# Patient Record
Sex: Female | Born: 1980 | Race: White | Hispanic: No | Marital: Single | Smoking: Current every day smoker
Health system: Southern US, Community
[De-identification: ages and names within clinical notes are randomized; demographics above are authoritative.]

## PROBLEM LIST (undated history)

## (undated) DIAGNOSIS — F319 Bipolar disorder, unspecified: Secondary | ICD-10-CM

## (undated) DIAGNOSIS — N200 Calculus of kidney: Secondary | ICD-10-CM

---

## 2006-09-16 HISTORY — PX: TUBAL LIGATION: SHX77

## 2008-01-12 ENCOUNTER — Emergency Department (HOSPITAL_COMMUNITY): Admission: EM | Admit: 2008-01-12 | Discharge: 2008-01-12 | Payer: Self-pay | Admitting: Emergency Medicine

## 2010-09-05 ENCOUNTER — Emergency Department (HOSPITAL_COMMUNITY)
Admission: EM | Admit: 2010-09-05 | Discharge: 2010-09-05 | Payer: Self-pay | Source: Home / Self Care | Admitting: Emergency Medicine

## 2010-10-29 HISTORY — PX: OTHER SURGICAL HISTORY: SHX169

## 2010-11-19 ENCOUNTER — Emergency Department (HOSPITAL_COMMUNITY)
Admission: EM | Admit: 2010-11-19 | Discharge: 2010-11-19 | Disposition: A | Discharge status: Other Acute Inpatient Hospital | Payer: Self-pay | Attending: Emergency Medicine | Admitting: Emergency Medicine

## 2010-11-19 ENCOUNTER — Inpatient Hospital Stay (HOSPITAL_COMMUNITY)
Admission: RE | Admit: 2010-11-19 | Discharge: 2010-11-22 | DRG: 885 | Disposition: A | Discharge status: Home / Self Care | Payer: PRIVATE HEALTH INSURANCE | Source: Ambulatory Visit | Attending: Psychiatry | Admitting: Psychiatry

## 2010-11-19 DIAGNOSIS — N390 Urinary tract infection, site not specified: Secondary | ICD-10-CM | POA: Insufficient documentation

## 2010-11-19 DIAGNOSIS — Z560 Unemployment, unspecified: Secondary | ICD-10-CM

## 2010-11-19 DIAGNOSIS — F319 Bipolar disorder, unspecified: Principal | ICD-10-CM

## 2010-11-19 DIAGNOSIS — R3919 Other difficulties with micturition: Secondary | ICD-10-CM | POA: Insufficient documentation

## 2010-11-19 DIAGNOSIS — IMO0002 Reserved for concepts with insufficient information to code with codable children: Secondary | ICD-10-CM

## 2010-11-19 DIAGNOSIS — R45851 Suicidal ideations: Secondary | ICD-10-CM | POA: Insufficient documentation

## 2010-11-19 DIAGNOSIS — F3130 Bipolar disorder, current episode depressed, mild or moderate severity, unspecified: Secondary | ICD-10-CM | POA: Insufficient documentation

## 2010-11-19 DIAGNOSIS — F29 Unspecified psychosis not due to a substance or known physiological condition: Secondary | ICD-10-CM

## 2010-11-19 DIAGNOSIS — F259 Schizoaffective disorder, unspecified: Secondary | ICD-10-CM

## 2010-11-19 DIAGNOSIS — Z79899 Other long term (current) drug therapy: Secondary | ICD-10-CM | POA: Insufficient documentation

## 2010-11-19 LAB — DIFFERENTIAL
Basophils Relative: 0 % (ref 0–1)
Eosinophils Relative: 3 % (ref 0–5)
Lymphocytes Relative: 19 % (ref 12–46)
Lymphs Abs: 2 10*3/uL (ref 0.7–4.0)
Monocytes Absolute: 0.6 10*3/uL (ref 0.1–1.0)
Monocytes Relative: 6 % (ref 3–12)

## 2010-11-19 LAB — PREGNANCY, URINE: Preg Test, Ur: NEGATIVE

## 2010-11-19 LAB — URINALYSIS, ROUTINE W REFLEX MICROSCOPIC
Ketones, ur: 15 mg/dL — AB
Specific Gravity, Urine: 1.026 (ref 1.005–1.030)

## 2010-11-19 LAB — URINE MICROSCOPIC-ADD ON

## 2010-11-19 LAB — CBC: MCHC: 34.8 g/dL (ref 30.0–36.0)

## 2010-11-19 LAB — COMPREHENSIVE METABOLIC PANEL
CO2: 26 mEq/L (ref 19–32)
Calcium: 9.3 mg/dL (ref 8.4–10.5)
Chloride: 108 mEq/L (ref 96–112)
Creatinine, Ser: 0.7 mg/dL (ref 0.4–1.2)
GFR calc Af Amer: >60 mL/min (ref >60)
Potassium: 4.2 mEq/L (ref 3.5–5.1)
Sodium: 140 mEq/L (ref 135–145)

## 2010-11-19 LAB — RAPID URINE DRUG SCREEN, HOSP PERFORMED
Barbiturates: NOT DETECTED
Opiates: NOT DETECTED
Tetrahydrocannabinol: POSITIVE — AB

## 2010-11-19 LAB — ETHANOL: Alcohol, Ethyl (B): <5 mg/dL (ref 0–10)

## 2010-11-20 DIAGNOSIS — F312 Bipolar disorder, current episode manic severe with psychotic features: Secondary | ICD-10-CM
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
## 2010-11-21 LAB — URINE CULTURE
Colony Count: >=100000
Culture  Setup Time: 201203060005

## 2010-12-11 NOTE — Discharge Summary (Signed)
  NAMEJOSSELIN, [name]             ACCOUNT NO.:  [id number]  MEDICAL RECORD NO.:  [id number]           PATIENT TYPE:  I  LOCATION:  0401                          FACILITY:  [facility]  PHYSICIAN:  Eulogio Ditch, MD DATE OF BIRTH:  [birth year]  DATE OF ADMISSION:  11/19/2010 DATE OF DISCHARGE:  11/22/2010                              DISCHARGE SUMMARY   IDENTIFYING INFORMATION:  This is a 30 year old single female.  This is a voluntary admission.  HISTORY OF PRESENT ILLNESS:  First [facility] admission for [name] who has a history of bipolar disorder and presented with an exacerbation of symptoms, complaining of having racing thoughts.  Family had complained to her that she was hyperverbal and having some vague auditory hallucinations of a female voice.  She had also been sleeping poorly and was having some suicidal thoughts fueled by voices telling her to be destructive and cut herself.  MEDICAL EVALUATION AND DIAGNOSTIC STUDIES:  Physical exam was done in the emergency room.  Urine drug screen was positive for marijuana. Pregnancy test negative.  She denied any chronic medical conditions.  COURSE OF HOSPITALIZATION:  She was admitted to our mood disorders program and reported that she had been using marijuana occasionally to help herself feel calmed down.  She reported previous successful trials with Lamictal, trazodone, Celexa, Klonopin and Depakote.  She was gradually assimilated into the milieu and was started on Lamictal 25 mg daily and 200 mg as needed for sleep.  Klonopin 0.5 mg b.i.d. was provided as needed for anxiety and she was started on Celexa 20 mg daily.  By November 21, 2010 she reported feeling much better, was feeling more like herself, had slept adequately, no dangerous thoughts.  She did complain of some dysuria and had a positive urine culture and was started on Macrodantin.  She had permission for [place] to speak with her friend with whom she lives.  Her friend indicated her  support and no safety concerns.  By November 22, 2010 she was ready for discharge.  No dangerous ideas, in full contact with reality and requesting to proceed with outpatient treatment.  DISCHARGE DIAGNOSIS:  AXIS I:  Bipolar disorder not otherwise specified. AXIS II:  No diagnosis. AXIS III:  Urinary tract infection. AXIS IV: No diagnosis. AXIS V: 56.  DISCHARGE MEDICATIONS:  Celexa 20 mg daily. Klonopin 0.5 mg t.i.d. p.r.n. anxiety. Lamotrigine 25 mg b.i.d. Nitrofurantoin 50 mg q.i.d. until finished. Risperdal 0.5 mg b.i.d. Trazodone 100 mg daily at bedtime. Albuterol 2 puffs b.i.d. p.r.n. for asthma.  FOLLOWUP PLAN:  She is to follow up with Wallace Keller on December 10, 2010 at 11:30 a.m. at [facility] at [place].     Margaret A. Lorin Picket, N.P.   ______________________________ Eulogio Ditch, MD    MAS/MEDQ  D:  12/10/2010  T:  12/10/2010  Job:  [id number]  Electronically Signed by Kari Baars N.P. on 12/10/2010 04:01:07 PM Electronically Signed by Eulogio Ditch  on 12/11/2010 02:40:56 PM

## 2011-06-11 LAB — URINALYSIS, ROUTINE W REFLEX MICROSCOPIC
Bilirubin Urine: NEGATIVE
Glucose, UA: NEGATIVE
Hgb urine dipstick: NEGATIVE
Ketones, ur: NEGATIVE
Nitrite: NEGATIVE
Protein, ur: NEGATIVE

## 2011-06-11 LAB — WET PREP, GENITAL
Trich, Wet Prep: NONE SEEN
Yeast Wet Prep HPF POC: NONE SEEN

## 2011-06-11 LAB — URINE MICROSCOPIC-ADD ON

## 2011-06-11 LAB — POCT PREGNANCY, URINE: Operator id: 234501

## 2011-10-26 ENCOUNTER — Inpatient Hospital Stay (HOSPITAL_COMMUNITY)
Admission: EM | Admit: 2011-10-26 | Discharge: 2011-11-01 | DRG: 661 | Disposition: A | Discharge status: Home / Self Care | Payer: Self-pay | Attending: Internal Medicine | Admitting: Internal Medicine

## 2011-10-26 ENCOUNTER — Encounter (HOSPITAL_COMMUNITY): Payer: Self-pay | Admitting: *Deleted

## 2011-10-26 DIAGNOSIS — F319 Bipolar disorder, unspecified: Secondary | ICD-10-CM

## 2011-10-26 DIAGNOSIS — D649 Anemia, unspecified: Secondary | ICD-10-CM

## 2011-10-26 DIAGNOSIS — F17200 Nicotine dependence, unspecified, uncomplicated: Secondary | ICD-10-CM | POA: Diagnosis present

## 2011-10-26 DIAGNOSIS — K5900 Constipation, unspecified: Secondary | ICD-10-CM

## 2011-10-26 DIAGNOSIS — D509 Iron deficiency anemia, unspecified: Secondary | ICD-10-CM | POA: Diagnosis present

## 2011-10-26 DIAGNOSIS — N1330 Unspecified hydronephrosis: Secondary | ICD-10-CM | POA: Diagnosis present

## 2011-10-26 DIAGNOSIS — N390 Urinary tract infection, site not specified: Secondary | ICD-10-CM | POA: Diagnosis present

## 2011-10-26 DIAGNOSIS — B964 Proteus (mirabilis) (morganii) as the cause of diseases classified elsewhere: Secondary | ICD-10-CM | POA: Diagnosis present

## 2011-10-26 DIAGNOSIS — D72829 Elevated white blood cell count, unspecified: Secondary | ICD-10-CM | POA: Diagnosis present

## 2011-10-26 DIAGNOSIS — N200 Calculus of kidney: Secondary | ICD-10-CM | POA: Diagnosis present

## 2011-10-26 DIAGNOSIS — N12 Tubulo-interstitial nephritis, not specified as acute or chronic: Secondary | ICD-10-CM | POA: Diagnosis present

## 2011-10-26 DIAGNOSIS — N10 Acute tubulo-interstitial nephritis: Principal | ICD-10-CM | POA: Diagnosis present

## 2011-10-26 DIAGNOSIS — Z720 Tobacco use: Secondary | ICD-10-CM | POA: Diagnosis present

## 2011-10-26 HISTORY — DX: Bipolar disorder, unspecified: F31.9

## 2011-10-26 LAB — URINALYSIS, ROUTINE W REFLEX MICROSCOPIC
Bilirubin Urine: NEGATIVE
Nitrite: NEGATIVE
Specific Gravity, Urine: 1.027 (ref 1.005–1.030)
pH: 6 (ref 5.0–8.0)

## 2011-10-26 LAB — DIFFERENTIAL
Basophils Relative: 0 % (ref 0–1)
Eosinophils Relative: 0 % (ref 0–5)
Lymphocytes Relative: 5 % — ABNORMAL LOW (ref 12–46)
Lymphs Abs: 1.1 10*3/uL (ref 0.7–4.0)
Monocytes Relative: 4 % (ref 3–12)
Neutro Abs: 20.6 10*3/uL — ABNORMAL HIGH (ref 1.7–7.7)
Neutrophils Relative %: 91 % — ABNORMAL HIGH (ref 43–77)

## 2011-10-26 LAB — POCT I-STAT, CHEM 8
BUN: 7 mg/dL (ref 6–23)
Chloride: 105 mEq/L (ref 96–112)
Creatinine, Ser: 0.9 mg/dL (ref 0.50–1.10)
Glucose, Bld: 127 mg/dL — ABNORMAL HIGH (ref 70–99)
Hemoglobin: 13.9 g/dL (ref 12.0–15.0)
Potassium: 3.5 mEq/L (ref 3.5–5.1)
Sodium: 138 mEq/L (ref 135–145)

## 2011-10-26 LAB — URINE MICROSCOPIC-ADD ON

## 2011-10-26 LAB — CBC
RBC: 4.11 MIL/uL (ref 3.87–5.11)
WBC: 22.6 10*3/uL — ABNORMAL HIGH (ref 4.0–10.5)

## 2011-10-26 MED ORDER — PANTOPRAZOLE SODIUM 40 MG IV SOLR
40.0000 mg | INTRAVENOUS | Status: DC
  Administered 2011-10-26 – 2011-10-31 (×6): 40 mg via INTRAVENOUS
  Filled 2011-10-26 (×7): qty 40

## 2011-10-26 MED ORDER — PROMETHAZINE HCL 25 MG/ML IJ SOLN
25.0000 mg | Freq: Four times a day (QID) | INTRAMUSCULAR | Status: DC | PRN
  Administered 2011-10-26 – 2011-10-31 (×14): 25 mg via INTRAVENOUS
  Filled 2011-10-26 (×14): qty 1

## 2011-10-26 MED ORDER — ONDANSETRON HCL 4 MG/2ML IJ SOLN
4.0000 mg | Freq: Once | INTRAMUSCULAR | Status: AC
  Administered 2011-10-26: 4 mg via INTRAVENOUS
  Filled 2011-10-26: qty 2

## 2011-10-26 MED ORDER — IBUPROFEN 600 MG PO TABS
600.0000 mg | ORAL_TABLET | Freq: Four times a day (QID) | ORAL | Status: DC | PRN
  Filled 2011-10-26: qty 1

## 2011-10-26 MED ORDER — HYDROMORPHONE HCL PF 1 MG/ML IJ SOLN
1.0000 mg | Freq: Once | INTRAMUSCULAR | Status: AC
  Administered 2011-10-26: 1 mg via INTRAVENOUS
  Filled 2011-10-26: qty 1

## 2011-10-26 MED ORDER — ACETAMINOPHEN 325 MG PO TABS
650.0000 mg | ORAL_TABLET | Freq: Four times a day (QID) | ORAL | Status: DC | PRN
  Administered 2011-10-27: 650 mg via ORAL
  Filled 2011-10-26: qty 2

## 2011-10-26 MED ORDER — SODIUM CHLORIDE 0.9 % IV SOLN: INTRAVENOUS | Status: DC

## 2011-10-26 MED ORDER — ONDANSETRON HCL 4 MG/2ML IJ SOLN
4.0000 mg | Freq: Four times a day (QID) | INTRAMUSCULAR | Status: DC | PRN
  Administered 2011-10-26 – 2011-11-01 (×12): 4 mg via INTRAVENOUS
  Filled 2011-10-26 (×11): qty 2

## 2011-10-26 MED ORDER — DEXTROSE 5 % IV SOLN
1.0000 g | INTRAVENOUS | Status: DC
  Administered 2011-10-26: 1 g via INTRAVENOUS
  Filled 2011-10-26 (×2): qty 10

## 2011-10-26 MED ORDER — HYDROMORPHONE HCL PF 1 MG/ML IJ SOLN
1.0000 mg | INTRAMUSCULAR | Status: DC | PRN
  Administered 2011-10-26 – 2011-10-27 (×4): 2 mg via INTRAVENOUS
  Administered 2011-10-27: 1 mg via INTRAVENOUS
  Administered 2011-10-27 – 2011-10-31 (×25): 2 mg via INTRAVENOUS
  Administered 2011-10-31 (×2): 1 mg via INTRAVENOUS
  Administered 2011-10-31 – 2011-11-01 (×2): 2 mg via INTRAVENOUS
  Filled 2011-10-26 (×5): qty 2
  Filled 2011-10-26: qty 1
  Filled 2011-10-26 (×10): qty 2
  Filled 2011-10-26: qty 1
  Filled 2011-10-26 (×5): qty 2
  Filled 2011-10-26: qty 1
  Filled 2011-10-26 (×11): qty 2

## 2011-10-26 MED ORDER — HYDROMORPHONE HCL PF 1 MG/ML IJ SOLN
1.0000 mg | INTRAMUSCULAR | Status: DC | PRN
  Administered 2011-10-26: 1 mg via INTRAVENOUS
  Administered 2011-10-26: 2 mg via INTRAVENOUS
  Administered 2011-10-26: 1 mg via INTRAVENOUS
  Administered 2011-10-26: 2 mg via INTRAVENOUS
  Filled 2011-10-26: qty 2
  Filled 2011-10-26 (×2): qty 1
  Filled 2011-10-26: qty 2

## 2011-10-26 MED ORDER — BISACODYL 5 MG PO TBEC
5.0000 mg | DELAYED_RELEASE_TABLET | Freq: Every day | ORAL | Status: DC | PRN
  Administered 2011-10-31: 5 mg via ORAL
  Filled 2011-10-26: qty 1

## 2011-10-26 MED ORDER — ONDANSETRON HCL 4 MG/2ML IJ SOLN
INTRAMUSCULAR | Status: AC
  Administered 2011-10-26: 4 mg via INTRAVENOUS
  Filled 2011-10-26: qty 2

## 2011-10-26 MED ORDER — ONDANSETRON HCL 4 MG PO TABS: 4.0000 mg | ORAL_TABLET | Freq: Four times a day (QID) | ORAL | Status: DC | PRN

## 2011-10-26 MED ORDER — SODIUM CHLORIDE 0.9 % IV SOLN
20.0000 mL | INTRAVENOUS | Status: DC
  Administered 2011-10-26: 20 mL via INTRAVENOUS

## 2011-10-26 MED ORDER — ACETAMINOPHEN 650 MG RE SUPP: 650.0000 mg | Freq: Four times a day (QID) | RECTAL | Status: DC | PRN

## 2011-10-26 MED ORDER — POTASSIUM CHLORIDE IN NACL 20-0.9 MEQ/L-% IV SOLN
INTRAVENOUS | Status: DC
  Administered 2011-10-26 – 2011-11-01 (×13): via INTRAVENOUS
  Filled 2011-10-26 (×21): qty 1000

## 2011-10-26 NOTE — ED Notes (Signed)
Pt posted for admission....waiting for bed assignment

## 2011-10-26 NOTE — ED Provider Notes (Signed)
History     CSN: [id number]  Arrival date & time 10/26/11  0226   First MD Initiated Contact with Patient 10/26/11 [phone]      Chief Complaint  Patient presents with  . Flank Pain  . Nausea  . Emesis    (Consider location/radiation/quality/duration/timing/severity/associated sxs/prior treatment) Patient is a 31 y.o. female presenting with flank pain and vomiting. The history is provided by the patient.  Flank Pain  Emesis    patient here with right-sided flank pain x3 days. She's had associated nausea and vomiting. Denies hematuria dysuria. History of pyelonephritis in the past. No vaginal bleeding or discharge. No prior history of kidney stones. Nothing makes her symptoms better or worse the  History reviewed. No pertinent past medical history.  Past Surgical History  Procedure Date  . Tubal ligation     History reviewed. No pertinent family history.  History  Substance Use Topics  . Smoking status: Current Everyday Smoker -- 1.0 packs/day    Types: Cigarettes  . Smokeless tobacco: Not on file  . Alcohol Use: No    OB History    Grav Para Term Preterm Abortions TAB SAB Ect Mult Living                  Review of Systems  Gastrointestinal: Positive for vomiting.  Genitourinary: Positive for flank pain.  All other systems reviewed and are negative.    Allergies  Codeine and Ultram  Home Medications   Current Outpatient Rx  Name Route Sig Dispense Refill  . IBUPROFEN 800 MG PO TABS Oral Take 800 mg by mouth every 8 (eight) hours as needed.      BP 112/72  Pulse 85  Temp(Src) 98.6 F (37 C) (Oral)  Resp 20  SpO2 99%  LMP 10/20/2011  Physical Exam  Nursing note and vitals reviewed. Constitutional: She is oriented to person, place, and time. Vital signs are normal. She appears well-developed and well-nourished.  Non-toxic appearance. No distress.  HENT:  Head: Normocephalic and atraumatic.  Eyes: Conjunctivae, EOM and lids are normal. Pupils are  equal, round, and reactive to light.  Neck: Normal range of motion. Neck supple. No tracheal deviation present. No mass present.  Cardiovascular: Normal rate, regular rhythm and normal heart sounds.  Exam reveals no gallop.   No murmur heard. Pulmonary/Chest: Effort normal and breath sounds normal. No stridor. No respiratory distress. She has no decreased breath sounds. She has no wheezes. She has no rhonchi. She has no rales.  Abdominal: Soft. Normal appearance and bowel sounds are normal. She exhibits no distension. There is no tenderness. There is CVA tenderness. There is no rigidity, no rebound and no guarding.  Musculoskeletal: Normal range of motion. She exhibits no edema and no tenderness.  Neurological: She is alert and oriented to person, place, and time. She has normal strength. No cranial nerve deficit or sensory deficit. GCS eye subscore is 4. GCS verbal subscore is 5. GCS motor subscore is 6.  Skin: Skin is warm and dry. No abrasion and no rash noted.  Psychiatric: She has a normal mood and affect. Her speech is normal and behavior is normal.    ED Course  Procedures (including critical care time)  Labs Reviewed  CBC - Abnormal; Notable for the following:    WBC 22.6 (*)    All other components within normal limits  URINALYSIS, ROUTINE W REFLEX MICROSCOPIC - Abnormal; Notable for the following:    APPearance TURBID (*)    Hgb  urine dipstick LARGE (*)    Ketones, ur TRACE (*)    Leukocytes, UA LARGE (*)    All other components within normal limits  POCT I-STAT, CHEM 8 - Abnormal; Notable for the following:    Glucose, Bld 127 (*)    All other components within normal limits  URINE MICROSCOPIC-ADD ON - Abnormal; Notable for the following:    Squamous Epithelial / LPF FEW (*)    Bacteria, UA MANY (*)    All other components within normal limits  DIFFERENTIAL  URINE CULTURE   No results found.   No diagnosis found.    MDM  Pt given iv fluids, pain meds , and  antibiotics--pain remains, will be admitted        Toy Baker, MD 10/26/11 [phone]

## 2011-10-26 NOTE — H&P (Signed)
PCP:   No primary provider on file.   Chief Complaint:  Left flank pain with nausea vomiting.  HPI: The patient is a pleasant 31 year old am white female who recently moved from [place], [place] and presents with presents with above complaints. She states that she was in her usual state of health are until 2 days ago when she developed an left flank and lower back pain. Despite taking ibuprofen the pain became more severe on day prior to admission she also began having nausea and vomiting-nonbloody. She admits to subjective fevers. She denies cough, diarrhea, melena and no hematochezia. She was seen in the ED and a urinalysis was done and came back consistent with a UTI. She was noted to have a leukocytosis of 22, she was started on empiric IV antibiotics after urine cultures were done. She is admitted for further evaluation and management.  Review of Systems:  The patient denies anorexia, weight loss,, vision loss, decreased hearing, hoarseness, chest pain, syncope, dyspnea on exertion, peripheral edema, balance deficits, hemoptysis, abdominal pain, melena, hematochezia, severe indigestion/heartburn, hematuria, incontinence, muscle weakness,  lesions, transient blindness, difficulty walking, depression, unusual weight change, abnormal bleeding, enlarged lymph nodes.  Past Medical History: Past Medical History  Diagnosis Date  . Bipolar disorder     not on meds   Past Surgical History  Procedure Date  . Tubal ligation     Medications: Prior to Admission medications   Medication Sig Start Date End Date Taking? Authorizing Provider  ibuprofen (ADVIL,MOTRIN) 800 MG tablet Take 800 mg by mouth every 8 (eight) hours as needed.   Yes Historical Provider, MD    Allergies:   Allergies  Allergen Reactions  . Codeine   . Ultram (Tramadol Hcl)     Social History:  reports that she has been smoking Cigarettes.  She has been smoking about 1 pack per day. She does not have any smokeless  tobacco history on file. She reports that she does not drink alcohol or use illicit drugs.  Family History: History reviewed. No pertinent family history.  Physical Exam: Filed Vitals:   10/26/11 0400 10/26/11 0430 10/26/11 0500 10/26/11 0726  BP: 115/75 104/61 113/75 129/114  Pulse: 57 70 59 94  Temp:    99.2 F (37.3 C)  TempSrc:    Oral  Resp:    18  SpO2: 97% 95% 95% 95%   Constitutional: Vital signs reviewed.  Patient is a well-developed and well-nourished in no acute distress and cooperative with exam. Alert and oriented x3.  Head: Normocephalic and atraumatic Mouth: no erythema or exudates, MMM Eyes: PERRL, EOMI, conjunctivae normal, No scleral icterus.  Neck: Supple, Trachea midline normal ROM, No JVD, mass, thyromegaly, or carotid bruit present.  Cardiovascular: RRR, S1 normal, S2 normal, no MRG, pulses symmetric and intact bilaterally Pulmonary/Chest: CTAB, no wheezes, rales, or rhonchi Abdominal: Soft. Mild epigastric tenderness, non-distended, bowel sounds are normal, no masses, organomegaly, or guarding present.  GU: Left CVA tenderness present   Extremities: No clubbing cyanosis or edema  Neurological: A&O x3, Strength is normal and symmetric bilaterally, cranial nerve II-XII are grossly intact, no focal motor deficit, sensory intact to light touch bilaterally.  Skin: Warm, dry and intact. No rash, cyanosis, or clubbing.  Psychiatric: Normal mood and affect. speech and behavior is normal. Judgment and thought content normal. Cognition and memory are normal.      Labs on Admission:   [facility] 10/26/11 0256  NA 138  K 3.5  CL 105  CO2 --  GLUCOSE 127*  BUN 7  CREATININE 0.90  CALCIUM --  MG --  PHOS --   No results found for this basename: AST:2,ALT:2,ALKPHOS:2,BILITOT:2,PROT:2,ALBUMIN:2 in the last 72 hours No results found for this basename: LIPASE:2,AMYLASE:2 in the last 72 hours  Basename 10/26/11 0256 10/26/11 0230  WBC -- 22.6*  NEUTROABS -- 20.6*   HGB 13.9 13.4  HCT 41.0 37.8  MCV -- 92.0  PLT -- 294   No results found for this basename: CKTOTAL:3,CKMB:3,CKMBINDEX:3,TROPONINI:3 in the last 72 hours No results found for this basename: TSH,T4TOTAL,FREET3,T3FREE,THYROIDAB in the last 72 hours No results found for this basename: VITAMINB12:2,FOLATE:2,FERRITIN:2,TIBC:2,IRON:2,RETICCTPCT:2 in the last 72 hours  Radiological Exams on Admission: No results found.  Assessment/Plan Present on Admission:  .Pyelonephritis -As discussed above, will obtain urine cultures continue empiric antibiotics with Rocephin pending cultures. Supportive care-antiemetics and pain management.  .Tobacco abuse-unspec Counseled to quit tobacco.  .Leukocytosis Secondary to #1. . Bipolar disorder She states that she's not been on any medications for long time and her faith has been very helpful in keeping her controlled.  Alaynna Kerwood C 10/26/2011, 8:58 AM

## 2011-10-26 NOTE — ED Notes (Signed)
Pt c/o L flank pain starting Thursday am, worsening over time. Pt c/o n/v starting x 2 hrs ago. Pt denies hematuria and dysuria.

## 2011-10-27 LAB — BASIC METABOLIC PANEL
CO2: 21 mEq/L (ref 19–32)
Calcium: 8.6 mg/dL (ref 8.4–10.5)
GFR calc non Af Amer: 74 mL/min — ABNORMAL LOW (ref >90)
Glucose, Bld: 103 mg/dL — ABNORMAL HIGH (ref 70–99)
Potassium: 4.2 mEq/L (ref 3.5–5.1)
Sodium: 136 mEq/L (ref 135–145)

## 2011-10-27 LAB — CBC
Hemoglobin: 10.8 g/dL — ABNORMAL LOW (ref 12.0–15.0)
MCH: 32 pg (ref 26.0–34.0)
MCHC: 34.2 g/dL (ref 30.0–36.0)
Platelets: 196 10*3/uL (ref 150–400)
RBC: 3.37 MIL/uL — ABNORMAL LOW (ref 3.87–5.11)

## 2011-10-27 MED ORDER — DEXTROSE 5 % IV SOLN
1.0000 g | INTRAVENOUS | Status: DC
  Administered 2011-10-28 – 2011-11-01 (×4): 1 g via INTRAVENOUS
  Filled 2011-10-27 (×7): qty 10

## 2011-10-27 MED ORDER — DEXTROSE 5 % IV SOLN
1.0000 g | Freq: Once | INTRAVENOUS | Status: AC
  Administered 2011-10-27: 1 g via INTRAVENOUS
  Filled 2011-10-27: qty 10

## 2011-10-27 MED ORDER — METOCLOPRAMIDE HCL 5 MG/ML IJ SOLN
5.0000 mg | Freq: Three times a day (TID) | INTRAMUSCULAR | Status: DC
  Administered 2011-10-27 – 2011-10-28 (×5): 5 mg via INTRAVENOUS
  Filled 2011-10-27 (×8): qty 1
  Filled 2011-10-27 (×2): qty 2

## 2011-10-27 MED ORDER — OXYCODONE HCL 5 MG PO TABS
5.0000 mg | ORAL_TABLET | ORAL | Status: DC | PRN
  Administered 2011-10-28 – 2011-11-01 (×6): 5 mg via ORAL
  Filled 2011-10-27 (×6): qty 1

## 2011-10-27 NOTE — Progress Notes (Signed)
Subjective: Still with left flank pain and nausea/ vomiting Objective: Vital signs in last 24 hours: Temp:  [99.2 F (37.3 C)-100.7 F (38.2 C)] 99.4 F (37.4 C) (02/10 1508) Pulse Rate:  [80-106] 80  (02/10 1508) Resp:  [16-20] 16  (02/10 1508) BP: (101-137)/(47-65) 107/58 mmHg (02/10 1508) SpO2:  [96 %-98 %] 96 % (02/10 1508) Last BM Date: 10/25/11 Intake/Output from previous day: 02/09 0701 - 02/10 0700 In: 1455 [I.V.:1455] Out: 200 [Emesis/NG output:200] Intake/Output this shift: Total I/O In: -  Out: 500 [Urine:500]    General Appearance:    Alert, cooperative, no distress, appears stated age  Lungs:     Clear to auscultation bilaterally, respirations unlabored   Heart:    Regular rate and rhythm, S1 and S2 normal, no murmur, rub   or gallop  Abdomen:     Soft, bowel sounds active all four quadrants,    no masses, no organomegaly, CVA tenderness present on the left.   Extremities:   Extremities normal, atraumatic, no cyanosis or edema  Neurologic:   CNII-XII intact, normal strength, sensation and reflexes    throughout    Weight change:   Intake/Output Summary (Last 24 hours) at 10/27/11 1653 Last data filed at 10/27/11 1543  Gross per 24 hour  Intake   1455 ml  Output    700 ml  Net    755 ml    Lab Results:   Basename 10/27/11 0540 10/26/11 0256  NA 136 138  K 4.2 3.5  CL 106 105  CO2 21 --  GLUCOSE 103* 127*  BUN 6 7  CREATININE 1.01 0.90  CALCIUM 8.6 --    Basename 10/27/11 0540 10/26/11 0256 10/26/11 0230  WBC 12.9* -- 22.6*  HGB 10.8* 13.9 --  HCT 31.6* 41.0 --  PLT 196 -- 294  MCV 93.8 -- 92.0   PT/INR No results found for this basename: LABPROT:2,INR:2 in the last 72 hours ABG No results found for this basename: PHART:2,PCO2:2,PO2:2,HCO3:2 in the last 72 hours  Micro Results: Recent Results (from the past 240 hour(s))  URINE CULTURE     Status: Normal (Preliminary result)   Collection Time   10/26/11  3:11 AM      Component Value  Range Status Comment   Specimen Description URINE, CLEAN CATCH   Final    Special Requests NONE   Final    Culture  Setup Time [id number]   Final    Colony Count >=100,000 COLONIES/ML   Final    Culture PROTEUS MIRABILIS   Final    Report Status PENDING   Incomplete    Studies/Results: No results found. Medications:  Scheduled Meds:   . cefTRIAXone (ROCEPHIN)  IV  1 g Intravenous Q24H  . cefTRIAXone (ROCEPHIN)  IV  1 g Intravenous Once  . pantoprazole (PROTONIX) IV  40 mg Intravenous Q24H  . DISCONTD: sodium chloride   Intravenous STAT   Continuous Infusions:   . 0.9 % NaCl with KCl 20 mEq / L 100 mL/hr at 10/27/11 1114  . DISCONTD: sodium chloride 20 mL (10/26/11 0258)   PRN Meds:.acetaminophen, acetaminophen, bisacodyl, HYDROmorphone (DILAUDID) injection, ibuprofen, ondansetron (ZOFRAN) IV, ondansetron, promethazine, DISCONTD: HYDROmorphone Assessment/Plan: .Pyelonephritis/Proteus mirabilis UTI  -Continue Rocephin pending sensitivities. Supportive care-antiemetics, add Reglan, continue pain management.  .Tobacco abuse-unspec  Counseled to quit tobacco.  .Leukocytosis  Secondary to #1, improving on antibiotics.  . Bipolar disorder  She states that she's not been on any medications for long time and her  LOS: 1 day   Youssouf Shipley C 10/27/2011, 4:53 PM

## 2011-10-28 LAB — URINE CULTURE
Colony Count: >=100000
Culture  Setup Time: 201302091127

## 2011-10-28 LAB — CBC
HCT: 33.5 % — ABNORMAL LOW (ref 36.0–46.0)
Hemoglobin: 11.3 g/dL — ABNORMAL LOW (ref 12.0–15.0)
WBC: 11 10*3/uL — ABNORMAL HIGH (ref 4.0–10.5)

## 2011-10-28 MED ORDER — METOCLOPRAMIDE HCL 5 MG/ML IJ SOLN
10.0000 mg | Freq: Three times a day (TID) | INTRAMUSCULAR | Status: DC
  Administered 2011-10-28 – 2011-10-31 (×13): 10 mg via INTRAVENOUS
  Filled 2011-10-28 (×18): qty 2

## 2011-10-28 NOTE — Progress Notes (Signed)
UR CHART REVIEWED; B Annielee Jemmott RN, BSN, MHA 

## 2011-10-28 NOTE — Progress Notes (Signed)
Subjective: Still with left flank pain, nausea/ vomiting better Objective: Vital signs in last 24 hours: Temp:  [100 F (37.8 Sullivan)-100.5 F (38.1 Sullivan)] 100 F (37.8 Sullivan) (02/11 1627) Pulse Rate:  [70-87] 86  (02/11 1627) Resp:  [11-18] 11  (02/11 1627) BP: (107-129)/(64-70) 129/67 mmHg (02/11 1627) SpO2:  [91 %-92 %] 92 % (02/11 1627) Last BM Date: 10/25/11 Intake/Output from previous day: 02/10 0701 - 02/11 0700 In: 1200 [I.V.:1150; IV Piggyback:50] Out: 2000 [Urine:2000] Intake/Output this shift:      General Appearance:    Alert, cooperative, no distress, appears stated age  Lungs:     Clear to auscultation bilaterally, respirations unlabored   Heart:    Regular rate and rhythm, S1 and S2 normal, no murmur, rub   or gallop  Abdomen:     Soft, bowel sounds active all four quadrants,    no masses, no organomegaly, CVA tenderness present on the left.   Extremities:   Extremities normal, atraumatic, no cyanosis or edema  Neurologic:   CNII-XII intact, normal strength, sensation and reflexes    throughout    Weight change:   Intake/Output Summary (Last 24 hours) at 10/28/11 1914 Last data filed at 10/28/11 1750  Gross per 24 hour  Intake 2383.34 ml  Output   1400 ml  Net 983.34 ml    Lab Results:   Basename 10/27/11 0540 10/26/11 0256  NA 136 138  K 4.2 3.5  CL 106 105  CO2 21 --  GLUCOSE 103* 127*  BUN 6 7  CREATININE 1.01 0.90  CALCIUM 8.6 --    Basename 10/28/11 0513 10/27/11 0540  WBC 11.0* 12.9*  HGB 11.3* 10.8*  HCT 33.5* 31.6*  PLT 203 196  MCV 93.8 93.8   PT/INR No results found for this basename: LABPROT:2,INR:2 in the last 72 hours ABG No results found for this basename: PHART:2,PCO2:2,PO2:2,HCO3:2 in the last 72 hours  Micro Results: Recent Results (from the past 240 hour(s))  URINE CULTURE     Status: Normal   Collection Time   10/26/11  3:11 AM      Component Value Range Status Comment   Specimen Description URINE, CLEAN CATCH   Final    Special Requests NONE   Final    Culture  Setup Time [id number]   Final    Colony Count >=100,000 COLONIES/ML   Final    Culture PROTEUS MIRABILIS   Final    Report Status 10/28/2011 FINAL   Final    Organism ID, Bacteria PROTEUS MIRABILIS   Final    Studies/Results: No results found. Medications:  Scheduled Meds:    . cefTRIAXone (ROCEPHIN)  IV  1 g Intravenous Q24H  . metoCLOPramide (REGLAN) injection  5 mg Intravenous TID AC & HS  . pantoprazole (PROTONIX) IV  40 mg Intravenous Q24H   Continuous Infusions:    . 0.9 % NaCl with KCl 20 mEq / L 100 mL/hr at 10/28/11 1607   PRN Meds:.acetaminophen, acetaminophen, bisacodyl, HYDROmorphone (DILAUDID) injection, ondansetron (ZOFRAN) IV, ondansetron, oxyCODONE, promethazine Assessment/Plan: .Pyelonephritis/Proteus mirabilis UTI  -Continue Rocephin for now, still awaiting sensitivities. Supportive care-antiemetics, increase Reglan, continue pain management.  -She is still febrile, will obtain renal ultrasound to evaluate for abscess.  .Tobacco abuse-unspec  Counseled to quit tobacco.  .Leukocytosis  Secondary to #1, improving on antibiotics.  . Bipolar disorder  She states that she's not been on any medications for long time and her faith has helped her to keep her bipolar controlled.  LOS: 2 days   [name] 10/28/2011, 7:14 PM

## 2011-10-29 ENCOUNTER — Inpatient Hospital Stay (HOSPITAL_COMMUNITY): Payer: Self-pay

## 2011-10-29 LAB — BASIC METABOLIC PANEL
BUN: 6 mg/dL (ref 6–23)
Chloride: 102 mEq/L (ref 96–112)
Glucose, Bld: 85 mg/dL (ref 70–99)
Potassium: 4.1 mEq/L (ref 3.5–5.1)

## 2011-10-29 LAB — CBC
HCT: 30.7 % — ABNORMAL LOW (ref 36.0–46.0)
Hemoglobin: 10.5 g/dL — ABNORMAL LOW (ref 12.0–15.0)
MCH: 31.2 pg (ref 26.0–34.0)
MCHC: 34.2 g/dL (ref 30.0–36.0)

## 2011-10-29 MED ORDER — KETOROLAC TROMETHAMINE 30 MG/ML IJ SOLN: 30.0000 mg | Freq: Three times a day (TID) | INTRAMUSCULAR | Status: DC

## 2011-10-29 MED ORDER — NICOTINE 21 MG/24HR TD PT24
21.0000 mg | MEDICATED_PATCH | Freq: Every day | TRANSDERMAL | Status: DC
  Administered 2011-10-29 – 2011-11-01 (×4): 21 mg via TRANSDERMAL
  Filled 2011-10-29 (×5): qty 1

## 2011-10-29 NOTE — Consult Note (Signed)
Urology Consult  CC: Left flank pain  HPI: 31 year old female who approximately 6 days ago developed pain all over her body, shakes, chills, left flank pain, nausea and vomiting. She presented to the emergency room where she was found to have a urinary tract infection. She was admitted for pain management as well as antibiotics. Her urine culture is growing Proteus mirabilis. She has been on appropriate antibiotic management. Because of her persistent left flank pain, renal ultrasound was performed. This revealed normal size kidneys, mild to moderate left hydronephrosis with a prominent renal pelvis. Ureteral jets were seen bilaterally.  She has had improvement of her pain, and overall clinical condition since she has been in the hospital. She has had decreasing white blood count.  She denies any long-standing history of left flank pain or recurrent pyelonephritis. She has had several urinary tract infections in the past which have been cystitis in nature. She has not had any kidney stones, nor is there family history of kidney stones. She has not had gross hematuria recently, dysuria, frequency or urgency.  PMH: Past Medical History  Diagnosis Date  . Bipolar disorder     not on meds    PSH: Past Surgical History  Procedure Date  . Tubal ligation     Allergies: Allergies  Allergen Reactions  . Codeine   . Ultram (Tramadol Hcl)     Medications: Prescriptions prior to admission  Medication Sig Dispense Refill  . ibuprofen (ADVIL,MOTRIN) 800 MG tablet Take 800 mg by mouth every 8 (eight) hours as needed.         Social History: History   Social History  . Marital Status: Single    Spouse Name: N/A    Number of Children: N/A  . Years of Education: N/A   Occupational History  . Not on file.   Social History Main Topics  . Smoking status: Current Everyday Smoker -- 1.0 packs/day    Types: Cigarettes  . Smokeless tobacco: Not on file  . Alcohol Use: No  . Drug Use: No   . Sexually Active: Yes    Birth Control/ Protection: Surgical   Other Topics Concern  . Not on file   Social History Narrative  . No narrative on file    Family History: History reviewed. No pertinent family history.  Review of Systems: Positive: Left flank and back pain, left lower quadrant pain, nausea, vomiting, shakes, chills. Negative: .  A further 10 point review of systems was negative except what is listed in the HPI.  Physical Exam: @VITALS2 @ General: No acute distress.  Awake. Head:  Normocephalic.  Atraumatic. ENT:  EOMI.  Mucous membranes moist Neck:  Supple.  No lymphadenopathy. CV:  S1 present. S2 present. Regular rate. Pulmonary: Equal effort bilaterally.  Clear to auscultation bilaterally. Abdomen: Soft, mildly tender to palpation in the left lower quadrant and left costo vertebral area. Skin:  Normal turgor.  No visible rash. Extremity: No gross deformity of bilateral upper extremities.  No gross deformity of bilateral lower extremities. Neurologic: Alert. Appropriate mood.    Studies:  Recent Labs  [facility] 10/29/11 0448 10/28/11 0513   HGB 10.5* 11.3*   WBC 8.2 11.0*   PLT 191 203    Recent Labs  Basename 10/29/11 0448 10/27/11 0540   NA 135 136   K 4.1 4.2   CL 102 106   CO2 22 21   BUN 6 6   CREATININE 0.89 1.01   CALCIUM 8.8 8.6  GFRNONAA 87* 74*   GFRAA >90 86*     No results found for this basename: PT:2,INR:2,APTT:2 in the last 72 hours   No components found with this basename: ABG:2  Review of the patient's renal ultrasound was performed. Images were personally reviewed. There is mild left hydronephrosis, with prominent pelvis and calyces. Renal lengths are normal. There is a ureteral jet seen bilaterally within the bladder.  Urine culture reveals Proteus, pan sensitive to all antibiotics but nitrofurantoin.  Assessment:  #1. Pyelonephritis. She seems to be improving clinically with adequate antibiotic management  2. Mild  hydronephrosis. This may be obstructive in nature, but may also be physiologic/congenital. Obstructive cause does need to be ruled out i.e. stone or extrinsic compression of ureter  Plan: 1. I would recommend anti-inflammatories for her pain-no doubt, this would improve her pain management better than narcotics  2. I will order a CT scan, stone protocol without IV or by mouth contrast, in order to better evaluate her left renal drainage.  3. Agree with current antibiotic management, but may change over to oral medication at any point.    Pager:[phone]

## 2011-10-29 NOTE — Progress Notes (Signed)
Subjective: nausea/ vomiting much improved, tolerating liquids. Objective: Vital signs in last 24 hours: Temp:  [99.4 F (37.4 C)-100 F (37.8 C)] 99.7 F (37.6 C) (02/12 1430) Pulse Rate:  [77-87] 77  (02/12 1430) Resp:  [11-20] 20  (02/12 1430) BP: (103-129)/(50-76) 111/50 mmHg (02/12 1430) SpO2:  [92 %-96 %] 92 % (02/12 1430) Last BM Date: 10/25/11 Intake/Output from previous day: 02/11 0701 - 02/12 0700 In: 2450 [I.V.:2400; IV Piggyback:50] Out: 300 [Urine:300] Intake/Output this shift: Total I/O In: 800 [I.V.:800] Out: 800 [Urine:800]    General Appearance:    Alert, cooperative, no distress, appears stated age  Lungs:     Clear to auscultation bilaterally, respirations unlabored   Heart:    Regular rate and rhythm, S1 and S2 normal, no murmur, rub   or gallop  Abdomen:     Soft, bowel sounds active all four quadrants,    no masses, no organomegaly, CVA tenderness present on the left.   Extremities:   Extremities normal, atraumatic, no cyanosis or edema  Neurologic:   CNII-XII intact, normal strength, sensation and reflexes    throughout    Weight change:   Intake/Output Summary (Last 24 hours) at 10/29/11 1626 Last data filed at 10/29/11 1400  Gross per 24 hour  Intake 2463.34 ml  Output    800 ml  Net 1663.34 ml    Lab Results:   Basename 10/29/11 0448 10/27/11 0540  NA 135 136  K 4.1 4.2  CL 102 106  CO2 22 21  GLUCOSE 85 103*  BUN 6 6  CREATININE 0.89 1.01  CALCIUM 8.8 8.6    Basename 10/29/11 0448 10/28/11 0513  WBC 8.2 11.0*  HGB 10.5* 11.3*  HCT 30.7* 33.5*  PLT 191 203  MCV 91.1 93.8   PT/INR No results found for this basename: LABPROT:2,INR:2 in the last 72 hours ABG No results found for this basename: PHART:2,PCO2:2,PO2:2,HCO3:2 in the last 72 hours  Micro Results: Recent Results (from the past 240 hour(s))  URINE CULTURE     Status: Normal   Collection Time   10/26/11  3:11 AM      Component Value Range Status Comment   Specimen Description URINE, CLEAN CATCH   Final    Special Requests NONE   Final    Culture  Setup Time [id number]   Final    Colony Count >=100,000 COLONIES/ML   Final    Culture PROTEUS MIRABILIS   Final    Report Status 10/28/2011 FINAL   Final    Organism ID, Bacteria PROTEUS MIRABILIS   Final    Studies/Results: US Renal  10/29/2011  *RADIOLOGY REPORT*  Clinical Data: Pyelonephritis.  Evaluate for abscess.  RENAL/URINARY TRACT ULTRASOUND COMPLETE  Comparison:  None.  Findings:  Right Kidney:  13.5 cm in length.  Normal renal cortical thickness and echogenicity.  No hydronephrosis or renal lesion.  No perinephric fluid collection.  Left Kidney:  13.7 cm in length.  Mild hydronephrosis.  No renal mass lesion or perinephric fluid collection.  Bladder:  Normal.  Bilateral ureteral jets are noted.  IMPRESSION: Left sided hydronephrosis of uncertain etiology.  No obvious renal mass or abscess.  Original Report Authenticated By: P. Loralie Champagne, M.D.   Medications:  Scheduled Meds:    . cefTRIAXone (ROCEPHIN)  IV  1 g Intravenous Q24H  . metoCLOPramide (REGLAN) injection  10 mg Intravenous TID AC & HS  . pantoprazole (PROTONIX) IV  40 mg Intravenous Q24H  . DISCONTD: metoCLOPramide (REGLAN) injection  5 mg Intravenous TID AC & HS   Continuous Infusions:    . 0.9 % NaCl with KCl 20 mEq / L 100 mL/hr at 10/29/11 0145   PRN Meds:.acetaminophen, acetaminophen, bisacodyl, HYDROmorphone (DILAUDID) injection, ondansetron (ZOFRAN) IV, ondansetron, oxyCODONE, promethazine Assessment/Plan: .Pyelonephritis/Proteus mirabilis UTI  -Continue Rocephin - proteus only resistant to nitrofurantoin. -beginning to improve, now afebrile, [place] w/o abscess, advance diet  .Hydronephrosis, L.Sided -unclear etiology, I have consulted urology .Tobacco abuse-unspec  Counseled to quit tobacco.  .Leukocytosis  -resolved,Secondary to #1.  . Bipolar disorder  She states that she's not been on any medications  for long time and her faith has helped her to keep her bipolar controlled.    LOS: 3 days   Saleha Kalp C 10/29/2011, 4:26 PM

## 2011-10-30 DIAGNOSIS — D649 Anemia, unspecified: Secondary | ICD-10-CM

## 2011-10-30 DIAGNOSIS — N200 Calculus of kidney: Secondary | ICD-10-CM | POA: Diagnosis present

## 2011-10-30 DIAGNOSIS — A499 Bacterial infection, unspecified: Secondary | ICD-10-CM | POA: Diagnosis present

## 2011-10-30 MED ORDER — DOCUSATE SODIUM 100 MG PO CAPS
100.0000 mg | ORAL_CAPSULE | Freq: Two times a day (BID) | ORAL | Status: DC
  Administered 2011-10-30 – 2011-11-01 (×3): 100 mg via ORAL
  Filled 2011-10-30 (×5): qty 1

## 2011-10-30 MED ORDER — IBUPROFEN 800 MG PO TABS
800.0000 mg | ORAL_TABLET | Freq: Three times a day (TID) | ORAL | Status: DC
  Administered 2011-10-30 – 2011-11-01 (×6): 800 mg via ORAL
  Filled 2011-10-30 (×9): qty 1

## 2011-10-30 NOTE — Progress Notes (Signed)
  Subjective: The patient has had some improvement in her pain. She is not having fevers.  Objective: Vital signs in last 24 hours: Temp:  [99.3 F (37.4 C)-99.7 F (37.6 C)] 99.3 F (37.4 C) (02/13 0634) Pulse Rate:  [72-87] 81  (02/13 0634) Resp:  [20] 20  (02/13 0634) BP: (102-111)/(50-69) 108/67 mmHg (02/13 0634) SpO2:  [92 %-98 %] 96 % (02/13 0634)  Intake/Output from previous day: 02/12 0701 - 02/13 0700 In: 2468.3 [I.V.:2468.3] Out: 800 [Urine:800] Intake/Output this shift: Total I/O In: 240 [P.O.:240] Out: -   Physical Exam:  She appears comfortable. There is less CVA tenderness.  Lab Results:  Basename 10/29/11 0448 10/28/11 0513  HGB 10.5* 11.3*  HCT 30.7* 33.5*   CT abdomen and pelvis was reviewed. She has a sizable left renal pelvic stone, layering posteriorly. There is moderate hydronephrosis. There are several lower pole stones, and a smaller upper pole stone on the left. I see no evidence of right sided urolithiasis. Assessment/Plan:  1. Pyelonephritis, left-sided, Proteus, treated with Rocephin at the present time.  2. Left hydronephrosis. This is secondary to a left renal pelvic stone which currently is not obstructing  3. Multiple left renal calculi, fairly sizable.    I would recommend that the patient have a percutaneous nephrostomy tube placed on the left. This would treat the patient's hydronephrosis, and any residual obstruction from her left renal pelvic stone. She will need adequate drainage of the left renal unit, and I think that that would be the best way, seeing that she will need treatment of her multiple stones. Because of the increased stone burden on the left, I would recommend eventual percutaneous nephrolithotomy.  I have discussed this with the patient. As she is improving from her pyelonephritis at the present time, I do not think that there is in emergent need for the percutaneous tube at this point. I think placement either later  today or tomorrow is adequate. Following that, she will need to be on adequate antibiotic therapy, followed by percutaneous nephrolithotomy.   Bertram Millard. Lelani Garnett, MD  10/30/2011, 10:59 AM

## 2011-10-30 NOTE — Progress Notes (Signed)
Subjective: Patient c/o left back pain with some improvement since admission.  Objective: Vital signs in last 24 hours: Filed Vitals:   10/29/11 1430 10/29/11 2225 10/30/11 0634 10/30/11 1340  BP: 111/50 102/53 108/67 98/46  Pulse: 77 72 81 87  Temp: 99.7 F (37.6 C) 99.6 F (37.6 C) 99.3 F (37.4 C) 98 F (36.7 C)  TempSrc: Oral Oral Oral Oral  Resp: 20 20 20 18   Height:      Weight:      SpO2: 92% 98% 96% 99%    Intake/Output Summary (Last 24 hours) at 10/30/11 1442 Last data filed at 10/30/11 0802  Gross per 24 hour  Intake 1908.33 ml  Output      0 ml  Net 1908.33 ml    Weight change:   General: Alert, awake, oriented x3, in no acute distress. Heart: Regular rate and rhythm, without murmurs, rubs, gallops. Lungs: Clear to auscultation bilaterally. Abdomen: Soft, nontender, nondistended, positive bowel sounds. L CVA TTP Extremities: No clubbing cyanosis or edema with positive pedal pulses. Neuro: Grossly intact, nonfocal.    Lab Results:  Basename 10/29/11 0448  NA 135  K 4.1  CL 102  CO2 22  GLUCOSE 85  BUN 6  CREATININE 0.89  CALCIUM 8.8  MG --  PHOS --   No results found for this basename: AST:2,ALT:2,ALKPHOS:2,BILITOT:2,PROT:2,ALBUMIN:2 in the last 72 hours No results found for this basename: LIPASE:2,AMYLASE:2 in the last 72 hours  Basename 10/29/11 0448 10/28/11 0513  WBC 8.2 11.0*  NEUTROABS -- --  HGB 10.5* 11.3*  HCT 30.7* 33.5*  MCV 91.1 93.8  PLT 191 203   No results found for this basename: CKTOTAL:3,CKMB:3,CKMBINDEX:3,TROPONINI:3 in the last 72 hours No components found with this basename: POCBNP:3 No results found for this basename: DDIMER:2 in the last 72 hours No results found for this basename: HGBA1C:2 in the last 72 hours No results found for this basename: CHOL:2,HDL:2,LDLCALC:2,TRIG:2,CHOLHDL:2,LDLDIRECT:2 in the last 72 hours No results found for this basename: TSH,T4TOTAL,FREET3,T3FREE,THYROIDAB in the last 72 hours No  results found for this basename: VITAMINB12:2,FOLATE:2,FERRITIN:2,TIBC:2,IRON:2,RETICCTPCT:2 in the last 72 hours  Micro Results: Recent Results (from the past 240 hour(s))  URINE CULTURE     Status: Normal   Collection Time   10/26/11  3:11 AM      Component Value Range Status Comment   Specimen Description URINE, CLEAN CATCH   Final    Special Requests NONE   Final    Culture  Setup Time [id number]   Final    Colony Count >=100,000 COLONIES/ML   Final    Culture PROTEUS MIRABILIS   Final    Report Status 10/28/2011 FINAL   Final    Organism ID, Bacteria PROTEUS MIRABILIS   Final     Studies/Results: Ct Abdomen Pelvis Wo Contrast  10/29/2011  *RADIOLOGY REPORT*  Clinical Data: Left-sided pain, hematuria, hydronephrosis on ultrasound  CT ABDOMEN AND PELVIS WITHOUT CONTRAST  Technique:  Multidetector CT imaging of the abdomen and pelvis was performed following the standard protocol without intravenous contrast.  Comparison: Ultrasound dated 10/29/2011  Findings: Trace bilateral pleural effusions with associated lower lobe atelectasis.  Unenhanced liver, spleen, pancreas, and adrenal glands within normal limits.  Gallbladder is unremarkable.  No intrahepatic or extrahepatic ductal dilatation.  Right kidney is within normal limits.  The left kidney is notable for at least three nonobstructing calculi measuring up to 6 mm. Left hydronephrosis.  12 mm calculus within a dilated proximal right renal collecting system (coronal image 77).  Mild perinephric/periureteral stranding.  No evidence of bowel obstruction.  Normal appendix.  No evidence of abdominal aortic aneurysm.  Suspicious abdominopelvic lymphadenopathy.  Uterus and bilateral ovaries are unremarkable.  Small volume pelvic ascites, likely physiologic.  No distal ureteral or bladder calculi.  Visualized osseous structures are within normal limits.  IMPRESSION: 12 mm calculus within a dilated proximal left renal collecting system.  Mild left  hydronephrosis with surrounding perinephric/periureteral stranding.  Three additional nonobstructing left renal calculi measuring up to 6 mm.  Original Report Authenticated By: Charline Bills, M.D.   US Renal  10/29/2011  *RADIOLOGY REPORT*  Clinical Data: Pyelonephritis.  Evaluate for abscess.  RENAL/URINARY TRACT ULTRASOUND COMPLETE  Comparison:  None.  Findings:  Right Kidney:  13.5 cm in length.  Normal renal cortical thickness and echogenicity.  No hydronephrosis or renal lesion.  No perinephric fluid collection.  Left Kidney:  13.7 cm in length.  Mild hydronephrosis.  No renal mass lesion or perinephric fluid collection.  Bladder:  Normal.  Bilateral ureteral jets are noted.  IMPRESSION: Left sided hydronephrosis of uncertain etiology.  No obvious renal mass or abscess.  Original Report Authenticated By: P. Loralie Champagne, M.D.    Medications:     . cefTRIAXone (ROCEPHIN)  IV  1 g Intravenous Q24H  . ibuprofen  800 mg Oral TID  . metoCLOPramide (REGLAN) injection  10 mg Intravenous TID AC & HS  . nicotine  21 mg Transdermal Daily  . pantoprazole (PROTONIX) IV  40 mg Intravenous Q24H  . DISCONTD: ketorolac  30 mg Intravenous Q8H    Assessment: Principal Problem:  *Pyelonephritis Active Problems:  Tobacco abuse-unspec  Leukocytosis  Kidney stones  UTI (urinary tract infection), bacterial  Anemia   Plan: Pyelonephritis/Proteus mirabilis UTI  -Continue Rocephin, and pain management - proteus only resistant to nitrofurantoin.  -beginning to improve, now afebrile, [place] w/o abscess. CT stone protocol with large left renal pelvic stone with moderate hydronephrosis and several lower pole stones. Urology ff  .Hydronephrosis, L.Sided  - secondary to large left renal pelvic stone per CT scan. Patient seen by urology and percutaneous tube to be placed with eventual percutaneous nephrolithotomy per Urology. Continue current IV antibiotics and follow. .Tobacco abuse-unspec  Counseled to  quit tobacco.  .Leukocytosis  -resolved,Secondary to #1.  . Bipolar disorder  She states that she's not been on any medications for long time and her faith has helped her to keep her bipolar controlled. Follow Anemia No overt GI bleed.  Maybe dilutional. Check anemia panel and follow H/H.      LOS: 4 days   [name] 10/30/2011, 2:42 PM

## 2011-10-31 ENCOUNTER — Inpatient Hospital Stay (HOSPITAL_COMMUNITY): Payer: Self-pay

## 2011-10-31 DIAGNOSIS — K5900 Constipation, unspecified: Secondary | ICD-10-CM

## 2011-10-31 LAB — DIFFERENTIAL
Basophils Relative: 1 % (ref 0–1)
Eosinophils Absolute: 0.5 10*3/uL (ref 0.0–0.7)
Lymphs Abs: 2.2 10*3/uL (ref 0.7–4.0)
Monocytes Absolute: 0.6 10*3/uL (ref 0.1–1.0)
Neutrophils Relative %: 40 % — ABNORMAL LOW (ref 43–77)

## 2011-10-31 LAB — CBC
Hemoglobin: 10.5 g/dL — ABNORMAL LOW (ref 12.0–15.0)
MCH: 31.3 pg (ref 26.0–34.0)
MCHC: 33.9 g/dL (ref 30.0–36.0)

## 2011-10-31 LAB — FERRITIN: Ferritin: 66 ng/mL (ref 10–291)

## 2011-10-31 LAB — IRON AND TIBC
Saturation Ratios: 21 % (ref 20–55)
TIBC: 169 ug/dL — ABNORMAL LOW (ref 250–470)
UIBC: 134 ug/dL (ref 125–400)

## 2011-10-31 LAB — PROTIME-INR: Prothrombin Time: 14 seconds (ref 11.6–15.2)

## 2011-10-31 LAB — BASIC METABOLIC PANEL
BUN: 7 mg/dL (ref 6–23)
Calcium: 8.9 mg/dL (ref 8.4–10.5)
GFR calc non Af Amer: >90 mL/min (ref >90)
Glucose, Bld: 89 mg/dL (ref 70–99)
Potassium: 4 mEq/L (ref 3.5–5.1)

## 2011-10-31 MED ORDER — POLYETHYLENE GLYCOL 3350 17 G PO PACK
17.0000 g | PACK | Freq: Two times a day (BID) | ORAL | Status: DC | PRN
  Administered 2011-10-31: 17 g via ORAL
  Filled 2011-10-31: qty 1

## 2011-10-31 MED ORDER — IOHEXOL 300 MG/ML  SOLN: 20.0000 mL | Freq: Once | INTRAMUSCULAR | Status: AC | PRN

## 2011-10-31 MED ORDER — CIPROFLOXACIN IN D5W 400 MG/200ML IV SOLN
400.0000 mg | Freq: Once | INTRAVENOUS | Status: AC
  Administered 2011-10-31: 400 mg via INTRAVENOUS

## 2011-10-31 MED ORDER — LIDOCAINE HCL 1 % IJ SOLN
INTRAMUSCULAR | Status: AC
  Filled 2011-10-31: qty 20

## 2011-10-31 MED ORDER — FENTANYL CITRATE 0.05 MG/ML IJ SOLN
INTRAMUSCULAR | Status: AC | PRN
  Administered 2011-10-31 (×2): 100 ug via INTRAVENOUS

## 2011-10-31 MED ORDER — MIDAZOLAM HCL 5 MG/5ML IJ SOLN
INTRAMUSCULAR | Status: AC | PRN
  Administered 2011-10-31 (×2): 2 mg via INTRAVENOUS

## 2011-10-31 NOTE — Progress Notes (Signed)
Talked to patient about follow up medical care. Patient is new to this are since December and she is agreeable to go to [facility]. Eligibility apt made November 27, 2011 at 2:30 ptm and apt with Dr Venetia Night at [facility] for December 05, 2011 at 9 am; Also gave patient about the [employer]. Patient goes to [facility] to get her prescriptions filled and stated that she can pay for her prescriptions. Abelino Derrick RN, BSN, MHA.

## 2011-10-31 NOTE — Discharge Instructions (Addendum)
Apt with Dr Venetia Night at [facility] December 05, 2011 at 9 am; Please call to reschedule if unable to keep appointment.

## 2011-10-31 NOTE — Interval H&P Note (Cosign Needed)
History and Physical Interval Note:  10/31/2011 9:06 AM  Nanda Quinton  Is scheduled for a left percutaneous nephrostomy tube placement today. The various methods of treatment have been discussed with the patient and family. After consideration of risks, benefits and other options for treatment, the patient has consented to the above procedure.  The patients' history has been reviewed, patient examined, no change in status, stable for the above procedure.  I have reviewed the patients' chart and labs.  Questions were answered to the patient's satisfaction.   Past Medical History  Diagnosis Date  . Bipolar disorder     not on meds   Past Surgical History  Procedure Date  . Tubal ligation    Ct Abdomen Pelvis Wo Contrast  10/29/2011  *RADIOLOGY REPORT*  Clinical Data: Left-sided pain, hematuria, hydronephrosis on ultrasound  CT ABDOMEN AND PELVIS WITHOUT CONTRAST  Technique:  Multidetector CT imaging of the abdomen and pelvis was performed following the standard protocol without intravenous contrast.  Comparison: Ultrasound dated 10/29/2011  Findings: Trace bilateral pleural effusions with associated lower lobe atelectasis.  Unenhanced liver, spleen, pancreas, and adrenal glands within normal limits.  Gallbladder is unremarkable.  No intrahepatic or extrahepatic ductal dilatation.  Right kidney is within normal limits.  The left kidney is notable for at least three nonobstructing calculi measuring up to 6 mm. Left hydronephrosis.  12 mm calculus within a dilated proximal right renal collecting system (coronal image 77).  Mild perinephric/periureteral stranding.  No evidence of bowel obstruction.  Normal appendix.  No evidence of abdominal aortic aneurysm.  Suspicious abdominopelvic lymphadenopathy.  Uterus and bilateral ovaries are unremarkable.  Small volume pelvic ascites, likely physiologic.  No distal ureteral or bladder calculi.  Visualized osseous structures are within normal limits.   IMPRESSION: 12 mm calculus within a dilated proximal left renal collecting system.  Mild left hydronephrosis with surrounding perinephric/periureteral stranding.  Three additional nonobstructing left renal calculi measuring up to 6 mm.  Original Report Authenticated By: Charline Bills, M.D.   US Renal  10/29/2011  *RADIOLOGY REPORT*  Clinical Data: Pyelonephritis.  Evaluate for abscess.  RENAL/URINARY TRACT ULTRASOUND COMPLETE  Comparison:  None.  Findings:  Right Kidney:  13.5 cm in length.  Normal renal cortical thickness and echogenicity.  No hydronephrosis or renal lesion.  No perinephric fluid collection.  Left Kidney:  13.7 cm in length.  Mild hydronephrosis.  No renal mass lesion or perinephric fluid collection.  Bladder:  Normal.  Bilateral ureteral jets are noted.  IMPRESSION: Left sided hydronephrosis of uncertain etiology.  No obvious renal mass or abscess.  Original Report Authenticated By: P. Loralie Champagne, M.D.   Results for orders placed during the hospital encounter of 10/26/11  CBC      Component Value Range   WBC 22.6 (*) 4.0 - 10.5 (K/uL)   RBC 4.11  3.87 - 5.11 (MIL/uL)   Hemoglobin 13.4  12.0 - 15.0 (g/dL)   HCT 16.1  09.6 - 04.5 (%)   MCV 92.0  78.0 - 100.0 (fL)   MCH 32.6  26.0 - 34.0 (pg)   MCHC 35.4  30.0 - 36.0 (g/dL)   RDW 40.9  81.1 - 91.4 (%)   Platelets 294  150 - 400 (K/uL)  DIFFERENTIAL      Component Value Range   Neutrophils Relative 91 (*) 43 - 77 (%)   Lymphocytes Relative 5 (*) 12 - 46 (%)   Monocytes Relative 4  3 - 12 (%)   Eosinophils Relative 0  0 - 5 (%)   Basophils Relative 0  0 - 1 (%)   Neutro Abs 20.6 (*) 1.7 - 7.7 (K/uL)   Lymphs Abs 1.1  0.7 - 4.0 (K/uL)   Monocytes Absolute 0.9  0.1 - 1.0 (K/uL)   Eosinophils Absolute 0.0  0.0 - 0.7 (K/uL)   Basophils Absolute 0.0  0.0 - 0.1 (K/uL)   Smear Review MORPHOLOGY UNREMARKABLE    URINALYSIS, ROUTINE W REFLEX MICROSCOPIC      Component Value Range   Color, Urine YELLOW  YELLOW    APPearance  TURBID (*) CLEAR    Specific Gravity, Urine 1.027  1.005 - 1.030    pH 6.0  5.0 - 8.0    Glucose, UA NEGATIVE  NEGATIVE (mg/dL)   Hgb urine dipstick LARGE (*) NEGATIVE    Bilirubin Urine NEGATIVE  NEGATIVE    Ketones, ur TRACE (*) NEGATIVE (mg/dL)   Protein, ur NEGATIVE  NEGATIVE (mg/dL)   Urobilinogen, UA 0.2  0.0 - 1.0 (mg/dL)   Nitrite NEGATIVE  NEGATIVE    Leukocytes, UA LARGE (*) NEGATIVE   URINE CULTURE      Component Value Range   Specimen Description URINE, CLEAN CATCH     Special Requests NONE     Culture  Setup Time [id number]     Colony Count >=100,000 COLONIES/ML     Culture PROTEUS MIRABILIS     Report Status 10/28/2011 FINAL     Organism ID, Bacteria PROTEUS MIRABILIS    POCT I-STAT, CHEM 8      Component Value Range   Sodium 138  135 - 145 (mEq/L)   Potassium 3.5  3.5 - 5.1 (mEq/L)   Chloride 105  96 - 112 (mEq/L)   BUN 7  6 - 23 (mg/dL)   Creatinine, Ser 2.95  0.50 - 1.10 (mg/dL)   Glucose, Bld 284 (*) 70 - 99 (mg/dL)   Calcium, Ion 1.32  4.40 - 1.32 (mmol/L)   TCO2 21  0 - 100 (mmol/L)   Hemoglobin 13.9  12.0 - 15.0 (g/dL)   HCT 10.2  72.5 - 36.6 (%)  URINE MICROSCOPIC-ADD ON      Component Value Range   Squamous Epithelial / LPF FEW (*) RARE    WBC, UA TOO NUMEROUS TO COUNT  <3 (WBC/hpf)   RBC / HPF 0-2  <3 (RBC/hpf)   Bacteria, UA MANY (*) RARE   CBC      Component Value Range   WBC 12.9 (*) 4.0 - 10.5 (K/uL)   RBC 3.37 (*) 3.87 - 5.11 (MIL/uL)   Hemoglobin 10.8 (*) 12.0 - 15.0 (g/dL)   HCT 44.0 (*) 34.7 - 46.0 (%)   MCV 93.8  78.0 - 100.0 (fL)   MCH 32.0  26.0 - 34.0 (pg)   MCHC 34.2  30.0 - 36.0 (g/dL)   RDW 42.5  95.6 - 38.7 (%)   Platelets 196  150 - 400 (K/uL)  BASIC METABOLIC PANEL      Component Value Range   Sodium 136  135 - 145 (mEq/L)   Potassium 4.2  3.5 - 5.1 (mEq/L)   Chloride 106  96 - 112 (mEq/L)   CO2 21  19 - 32 (mEq/L)   Glucose, Bld 103 (*) 70 - 99 (mg/dL)   BUN 6  6 - 23 (mg/dL)   Creatinine, Ser 5.64  0.50 - 1.10  (mg/dL)   Calcium 8.6  8.4 - 33.2 (mg/dL)   GFR calc non Af Amer 74 (*) >90 (  mL/min)   GFR calc Af Amer 86 (*) >90 (mL/min)  CBC      Component Value Range   WBC 11.0 (*) 4.0 - 10.5 (K/uL)   RBC 3.57 (*) 3.87 - 5.11 (MIL/uL)   Hemoglobin 11.3 (*) 12.0 - 15.0 (g/dL)   HCT 16.1 (*) 09.6 - 46.0 (%)   MCV 93.8  78.0 - 100.0 (fL)   MCH 31.7  26.0 - 34.0 (pg)   MCHC 33.7  30.0 - 36.0 (g/dL)   RDW 04.5  40.9 - 81.1 (%)   Platelets 203  150 - 400 (K/uL)  BASIC METABOLIC PANEL      Component Value Range   Sodium 135  135 - 145 (mEq/L)   Potassium 4.1  3.5 - 5.1 (mEq/L)   Chloride 102  96 - 112 (mEq/L)   CO2 22  19 - 32 (mEq/L)   Glucose, Bld 85  70 - 99 (mg/dL)   BUN 6  6 - 23 (mg/dL)   Creatinine, Ser 9.14  0.50 - 1.10 (mg/dL)   Calcium 8.8  8.4 - 78.2 (mg/dL)   GFR calc non Af Amer 87 (*) >90 (mL/min)   GFR calc Af Amer >90  >90 (mL/min)  CBC      Component Value Range   WBC 8.2  4.0 - 10.5 (K/uL)   RBC 3.37 (*) 3.87 - 5.11 (MIL/uL)   Hemoglobin 10.5 (*) 12.0 - 15.0 (g/dL)   HCT 95.6 (*) 21.3 - 46.0 (%)   MCV 91.1  78.0 - 100.0 (fL)   MCH 31.2  26.0 - 34.0 (pg)   MCHC 34.2  30.0 - 36.0 (g/dL)   RDW 08.6  57.8 - 46.9 (%)   Platelets 191  150 - 400 (K/uL)  BASIC METABOLIC PANEL      Component Value Range   Sodium 141  135 - 145 (mEq/L)   Potassium 4.0  3.5 - 5.1 (mEq/L)   Chloride 105  96 - 112 (mEq/L)   CO2 26  19 - 32 (mEq/L)   Glucose, Bld 89  70 - 99 (mg/dL)   BUN 7  6 - 23 (mg/dL)   Creatinine, Ser 6.29  0.50 - 1.10 (mg/dL)   Calcium 8.9  8.4 - 52.8 (mg/dL)   GFR calc non Af Amer >90  >90 (mL/min)   GFR calc Af Amer >90  >90 (mL/min)  CBC      Component Value Range   WBC 5.6  4.0 - 10.5 (K/uL)   RBC 3.36 (*) 3.87 - 5.11 (MIL/uL)   Hemoglobin 10.5 (*) 12.0 - 15.0 (g/dL)   HCT 41.3 (*) 24.4 - 46.0 (%)   MCV 92.3  78.0 - 100.0 (fL)   MCH 31.3  26.0 - 34.0 (pg)   MCHC 33.9  30.0 - 36.0 (g/dL)   RDW 01.0  27.2 - 53.6 (%)   Platelets 214  150 - 400 (K/uL)    DIFFERENTIAL      Component Value Range   Neutrophils Relative 40 (*) 43 - 77 (%)   Lymphocytes Relative 40  12 - 46 (%)   Monocytes Relative 10  3 - 12 (%)   Eosinophils Relative 9 (*) 0 - 5 (%)   Basophils Relative 1  0 - 1 (%)   Neutro Abs 2.2  1.7 - 7.7 (K/uL)   Lymphs Abs 2.2  0.7 - 4.0 (K/uL)   Monocytes Absolute 0.6  0.1 - 1.0 (K/uL)   Eosinophils Absolute 0.5  0.0 - 0.7 (K/uL)  Basophils Absolute 0.1  0.0 - 0.1 (K/uL)   WBC Morphology ATYPICAL LYMPHOCYTES     Smear Review PLATELET COUNT CONFIRMED BY SMEAR    PROTIME-INR      Component Value Range   Prothrombin Time 14.0  11.6 - 15.2 (seconds)   INR 1.06  0.00 - 1.49   APTT      Component Value Range   aPTT 32  24 - 37 (seconds)     Jeffrey Graefe,D KEVIN

## 2011-10-31 NOTE — Progress Notes (Signed)
Order obtained for mid line this am from Dr. Janee Morn.  While pt in IR the staff there was able to place an iv.  Order for mid line d/c.  Dr Janee Morn informed.

## 2011-10-31 NOTE — H&P (View-Only) (Signed)
  Subjective: The patient is doing well.  No nausea or vomiting. Pain is adequately controlled. She is feeling better  Objective: Vital signs in last 24 hours: Temp:  [97.7 F (36.5 C)-98 F (36.7 C)] 97.7 F (36.5 C) (02/14 0550) Pulse Rate:  [62-87] 62  (02/14 0550) Resp:  [16-20] 20  (02/14 0550) BP: (98-115)/(46-77) 115/77 mmHg (02/14 0550) SpO2:  [94 %-99 %] 94 % (02/14 0550)  Intake/Output from previous day: 02/13 0701 - 02/14 0700 In: 2736.7 [P.O.:240; I.V.:2476.7; IV Piggyback:20] Out: -  Intake/Output this shift:    Physical Exam:  She appears comfortable  Lab Results:  Basename 10/31/11 0455 10/29/11 0448  HGB 10.5* 10.5*  HCT 31.0* 30.7*    Assessment/Plan: 1. Pyelonephritis with hydronephrosis, improving. She is on appropriate antibiotic management.  2. Symptomatic left renal calculi. She is scheduled for percutaneous nephrostomy tube placement on the left today. I think the patient should be able to leave tomorrow if she has not had a febrile response to that procedure. She will need followup with me later to schedule nephrolithotomy through percutaneous approach.   Torell Minder M. Tyne Banta, MD  10/31/2011, 7:44 AM           

## 2011-10-31 NOTE — Progress Notes (Signed)
Subjective: Patient c/o left back pain post procedure. Nausea and emesis improved. Tolerating POs.  Objective: Vital signs in last 24 hours: Filed Vitals:   10/31/11 1121 10/31/11 1128 10/31/11 1200 10/31/11 1230  BP: 139/80 129/73 112/71 102/66  Pulse: 67 72 67 72  Temp:   97.7 F (36.5 C) 97.4 F (36.3 C)  TempSrc:   Oral Oral  Resp: 13 15 16 18   Height:      Weight:      SpO2: 100% 99%      Intake/Output Summary (Last 24 hours) at 10/31/11 1312 Last data filed at 10/31/11 0500  Gross per 24 hour  Intake 2496.73 ml  Output      0 ml  Net 2496.73 ml    Weight change:   General: Alert, awake, oriented x3, in no acute distress. Heart: Regular rate and rhythm, without murmurs, rubs, gallops. Lungs: Clear to auscultation bilaterally. Abdomen: Soft, nontender, nondistended, positive bowel sounds. L CVA TTP. Percutaneous nephrosotmy intact.  Extremities: No clubbing cyanosis or edema with positive pedal pulses. Neuro: Grossly intact, nonfocal.    Lab Results:  Basename 10/31/11 0455 10/29/11 0448  NA 141 135  K 4.0 4.1  CL 105 102  CO2 26 22  GLUCOSE 89 85  BUN 7 6  CREATININE 0.66 0.89  CALCIUM 8.9 8.8  MG -- --  PHOS -- --   No results found for this basename: AST:2,ALT:2,ALKPHOS:2,BILITOT:2,PROT:2,ALBUMIN:2 in the last 72 hours No results found for this basename: LIPASE:2,AMYLASE:2 in the last 72 hours  Basename 10/31/11 0455 10/29/11 0448  WBC 5.6 8.2  NEUTROABS 2.2 --  HGB 10.5* 10.5*  HCT 31.0* 30.7*  MCV 92.3 91.1  PLT 214 191   No results found for this basename: CKTOTAL:3,CKMB:3,CKMBINDEX:3,TROPONINI:3 in the last 72 hours No components found with this basename: POCBNP:3 No results found for this basename: DDIMER:2 in the last 72 hours No results found for this basename: HGBA1C:2 in the last 72 hours No results found for this basename: CHOL:2,HDL:2,LDLCALC:2,TRIG:2,CHOLHDL:2,LDLDIRECT:2 in the last 72 hours No results found for this basename:  TSH,T4TOTAL,FREET3,T3FREE,THYROIDAB in the last 72 hours  Basename 10/31/11 0455  VITAMINB12 358  FOLATE 10.3  FERRITIN 66  TIBC 169*  IRON 35*  RETICCTPCT --    Micro Results: Recent Results (from the past 240 hour(s))  URINE CULTURE     Status: Normal   Collection Time   10/26/11  3:11 AM      Component Value Range Status Comment   Specimen Description URINE, CLEAN CATCH   Final    Special Requests NONE   Final    Culture  Setup Time [id number]   Final    Colony Count >=100,000 COLONIES/ML   Final    Culture PROTEUS MIRABILIS   Final    Report Status 10/28/2011 FINAL   Final    Organism ID, Bacteria PROTEUS MIRABILIS   Final     Studies/Results: Ct Abdomen Pelvis Wo Contrast  10/29/2011  *RADIOLOGY REPORT*  Clinical Data: Left-sided pain, hematuria, hydronephrosis on ultrasound  CT ABDOMEN AND PELVIS WITHOUT CONTRAST  Technique:  Multidetector CT imaging of the abdomen and pelvis was performed following the standard protocol without intravenous contrast.  Comparison: Ultrasound dated 10/29/2011  Findings: Trace bilateral pleural effusions with associated lower lobe atelectasis.  Unenhanced liver, spleen, pancreas, and adrenal glands within normal limits.  Gallbladder is unremarkable.  No intrahepatic or extrahepatic ductal dilatation.  Right kidney is within normal limits.  The left kidney is notable for at least  three nonobstructing calculi measuring up to 6 mm. Left hydronephrosis.  12 mm calculus within a dilated proximal right renal collecting system (coronal image 77).  Mild perinephric/periureteral stranding.  No evidence of bowel obstruction.  Normal appendix.  No evidence of abdominal aortic aneurysm.  Suspicious abdominopelvic lymphadenopathy.  Uterus and bilateral ovaries are unremarkable.  Small volume pelvic ascites, likely physiologic.  No distal ureteral or bladder calculi.  Visualized osseous structures are within normal limits.  IMPRESSION: 12 mm calculus within a  dilated proximal left renal collecting system.  Mild left hydronephrosis with surrounding perinephric/periureteral stranding.  Three additional nonobstructing left renal calculi measuring up to 6 mm.  Original Report Authenticated By: Charline Bills, M.D.   Ir Perc Nephrostomy Left  10/31/2011  *RADIOLOGY REPORT*  Clinical Data:Left nephrolithiasis, hydronephrosis, leukocytosis.  LEFT PERCUTANEOUS NEPHROSTOMY CATHETER PLACEMENT UNDER ULTRASOUND AND FLUOROSCOPIC GUIDANCE  Technique:  The procedure, risks (including but not limited to bleeding, infection, organ damage), benefits, and alternatives were explained to the patient.  Questions regarding the procedure were encouraged and answered.  The patient understands and consents to the procedure.Leftflank region prepped with Betadine, draped in usual sterile fashion, infiltrated locally with 1% lidocaine.  As antibiotic prophylaxis, Cipro 400 mg was ordered pre-procedure and administered intravenously within one hour of incision.  Intravenous Fentanyl and Versed were administered as conscious sedation during continuous cardiorespiratory monitoring by the radiology RN, with a total moderate sedation time of 10 minutes.  Under fluoroscopy, the stones evident on prior CT were only faintly visible, therefore I selected ultrasound guidance for initial placement. Under real-time ultrasound guidance, a 21-gauge trocar needle was advanced into a posterior lower pole calyx. Ultrasound image documentation was saved. Urine spontaneously returned through the needle. Needle was exchanged over a guidewire for transitional dilator. Contrast injection confirmed appropriate positioning. Catheter was exchanged over a guidewire for a 10 French pigtail catheter, formed centrally within the left renal collecting system. Contrast injection confirms appropriate positioning and patency. Catheter   secured externally with 0 Prolene suture and placed to external drain bag. No immediate  complication.  IMPRESSION Technically successful left percutaneous nephrostomy catheter placement.  Original Report Authenticated By: Osa Craver, M.D.   Ir US Guide Vasc Access Left  10/31/2011  *RADIOLOGY REPORT*  Clinical Data:Left nephrolithiasis, hydronephrosis, leukocytosis.  LEFT PERCUTANEOUS NEPHROSTOMY CATHETER PLACEMENT UNDER ULTRASOUND AND FLUOROSCOPIC GUIDANCE  Technique:  The procedure, risks (including but not limited to bleeding, infection, organ damage), benefits, and alternatives were explained to the patient.  Questions regarding the procedure were encouraged and answered.  The patient understands and consents to the procedure.Leftflank region prepped with Betadine, draped in usual sterile fashion, infiltrated locally with 1% lidocaine.  As antibiotic prophylaxis, Cipro 400 mg was ordered pre-procedure and administered intravenously within one hour of incision.  Intravenous Fentanyl and Versed were administered as conscious sedation during continuous cardiorespiratory monitoring by the radiology RN, with a total moderate sedation time of 10 minutes.  Under fluoroscopy, the stones evident on prior CT were only faintly visible, therefore I selected ultrasound guidance for initial placement. Under real-time ultrasound guidance, a 21-gauge trocar needle was advanced into a posterior lower pole calyx. Ultrasound image documentation was saved. Urine spontaneously returned through the needle. Needle was exchanged over a guidewire for transitional dilator. Contrast injection confirmed appropriate positioning. Catheter was exchanged over a guidewire for a 10 French pigtail catheter, formed centrally within the left renal collecting system. Contrast injection confirms appropriate positioning and patency. Catheter   secured externally with  0 Prolene suture and placed to external drain bag. No immediate complication.  IMPRESSION Technically successful left percutaneous nephrostomy catheter  placement.  Original Report Authenticated By: Osa Craver, M.D.    Medications:     . cefTRIAXone (ROCEPHIN)  IV  1 g Intravenous Q24H  . ciprofloxacin  400 mg Intravenous Once  . docusate sodium  100 mg Oral BID  . ibuprofen  800 mg Oral TID  . lidocaine      . metoCLOPramide (REGLAN) injection  10 mg Intravenous TID AC & HS  . nicotine  21 mg Transdermal Daily  . pantoprazole (PROTONIX) IV  40 mg Intravenous Q24H    Assessment: Principal Problem:  *Pyelonephritis Active Problems:  Tobacco abuse-unspec  Leukocytosis  Kidney stones  UTI (urinary tract infection), bacterial  Anemia  Constipation   Plan: Pyelonephritis/Proteus mirabilis UTI  -Continue Rocephin, and pain management - proteus only resistant to nitrofurantoin.  -beginning to improve, now afebrile, [place] w/o abscess. CT stone protocol with large left renal pelvic stone with moderate hydronephrosis and several lower pole stones. Urology ff  .Hydronephrosis, L.Sided  - secondary to large left renal pelvic stone per CT scan. Patient seen by urology and percutaneous tube  Placed today,  with eventual percutaneous nephrolithotomy per Urology. Continue current IV antibiotics and follow.  Constipation Dulcolax prn, miralax prn .Tobacco abuse-unspec  Counseled to quit tobacco.  .Leukocytosis  -resolved,Secondary to #1.  . Bipolar disorder  She states that she's not been on any medications for long time and her faith has helped her to keep her bipolar controlled. Follow  Iron deficiency Anemia No overt GI bleed.  Maybe dilutional. follow H/H.      LOS: 5 days   [name] 10/31/2011, 1:12 PM

## 2011-10-31 NOTE — Procedures (Signed)
L 68f PCN placed [place] and fluoro No complication No blood loss. See complete dictation in [facility].

## 2011-10-31 NOTE — Progress Notes (Signed)
Pt has not had a bm since the 8th dulcolax given as ordered.  Dr Janee Morn informed and order noted for miralax as needed if no bm tap water enema prn encouraged pt to keep nursing updated on if she has a bm or not.  Voices understanding.

## 2011-10-31 NOTE — Progress Notes (Signed)
  Subjective: The patient is doing well.  No nausea or vomiting. Pain is adequately controlled. She is feeling better  Objective: Vital signs in last 24 hours: Temp:  [97.7 F (36.5 C)-98 F (36.7 C)] 97.7 F (36.5 C) (02/14 0550) Pulse Rate:  [62-87] 62  (02/14 0550) Resp:  [16-20] 20  (02/14 0550) BP: (98-115)/(46-77) 115/77 mmHg (02/14 0550) SpO2:  [94 %-99 %] 94 % (02/14 0550)  Intake/Output from previous day: 02/13 0701 - 02/14 0700 In: 2736.7 [P.O.:240; I.V.:2476.7; IV Piggyback:20] Out: -  Intake/Output this shift:    Physical Exam:  She appears comfortable  Lab Results:  Basename 10/31/11 0455 10/29/11 0448  HGB 10.5* 10.5*  HCT 31.0* 30.7*    Assessment/Plan: 1. Pyelonephritis with hydronephrosis, improving. She is on appropriate antibiotic management.  2. Symptomatic left renal calculi. She is scheduled for percutaneous nephrostomy tube placement on the left today. I think the patient should be able to leave tomorrow if she has not had a febrile response to that procedure. She will need followup with me later to schedule nephrolithotomy through percutaneous approach.   Bertram Millard. Rahsaan Weakland, MD  10/31/2011, 7:44 AM

## 2011-11-01 LAB — BASIC METABOLIC PANEL
BUN: 5 mg/dL — ABNORMAL LOW (ref 6–23)
GFR calc Af Amer: >90 mL/min (ref >90)
GFR calc non Af Amer: >90 mL/min (ref >90)
Potassium: 3.9 mEq/L (ref 3.5–5.1)
Sodium: 138 mEq/L (ref 135–145)

## 2011-11-01 LAB — DIFFERENTIAL
Basophils Absolute: 0.1 10*3/uL (ref 0.0–0.1)
Basophils Relative: 1 % (ref 0–1)
Eosinophils Absolute: 0.5 10*3/uL (ref 0.0–0.7)
Lymphocytes Relative: 39 % (ref 12–46)
Neutrophils Relative %: 46 % (ref 43–77)

## 2011-11-01 LAB — CBC
Hemoglobin: 11.5 g/dL — ABNORMAL LOW (ref 12.0–15.0)
MCHC: 34.5 g/dL (ref 30.0–36.0)

## 2011-11-01 MED ORDER — CIPROFLOXACIN HCL 500 MG PO TABS: 500.0000 mg | ORAL_TABLET | Freq: Two times a day (BID) | ORAL | Status: AC

## 2011-11-01 MED ORDER — ONDANSETRON HCL 4 MG PO TABS: 4.0000 mg | ORAL_TABLET | Freq: Four times a day (QID) | ORAL | Status: AC | PRN

## 2011-11-01 MED ORDER — IBUPROFEN 800 MG PO TABS: 800.0000 mg | ORAL_TABLET | Freq: Three times a day (TID) | ORAL | Status: AC

## 2011-11-01 MED ORDER — DSS 100 MG PO CAPS: 100.0000 mg | ORAL_CAPSULE | Freq: Two times a day (BID) | ORAL | Status: AC

## 2011-11-01 MED ORDER — OXYCODONE HCL 5 MG PO TABS: 5.0000 mg | ORAL_TABLET | ORAL | Status: AC | PRN

## 2011-11-01 NOTE — Progress Notes (Signed)
  Subjective: She feels fine. No problems since perc tube placement  Objective: Vital signs in last 24 hours: Temp:  [97.4 F (36.3 C)-97.9 F (36.6 C)] 97.5 F (36.4 C) (02/15 0537) Pulse Rate:  [64-75] 67  (02/15 0537) Resp:  [13-21] 18  (02/15 0537) BP: (95-139)/(52-80) 112/76 mmHg (02/15 0537) SpO2:  [95 %-100 %] 95 % (02/15 0537)  Intake/Output from previous day: 02/14 0701 - 02/15 0700 In: 3670 [P.O.:240; I.V.:3125; IV Piggyback:300] Out: 1950 [Urine:1950] Intake/Output this shift:    Physical Exam:  Urine is clear in nephr. bag  Lab Results:  Basename 11/01/11 0446 10/31/11 0455  HGB 11.5* 10.5*  HCT 33.3* 31.0*    Assessment/Plan: S/p perc tube placement for obstructing/infected stones. OK to go home from my standpoint. She'll followup w/ me next wweek  Bertram Millard. Lezley Bedgood, MD  11/01/2011, 7:42 AM

## 2011-11-01 NOTE — Progress Notes (Signed)
Subjective: Patient doing well, has some mild left flank pain; awaiting d/c home today  Objective: Vital signs in last 24 hours: Temp:  [97.4 F (36.3 C)-97.9 F (36.6 C)] 97.5 F (36.4 C) (02/15 0537) Pulse Rate:  [64-75] 67  (02/15 0537) Resp:  [13-21] 18  (02/15 0537) BP: (95-139)/(52-80) 112/76 mmHg (02/15 0537) SpO2:  [95 %-100 %] 95 % (02/15 0537) Last BM Date: 10/25/11  Intake/Output from previous day: 02/14 0701 - 02/15 0700 In: 3670 [P.O.:240; I.V.:3125; IV Piggyback:300] Out: 1950 [Urine:1950] Intake/Output this shift:    Left PCN intact, output about 1400 cc's last 24 hours, no sig. Hematuria, insertion site ok, mildly tender  Lab Results:   Basename 11/01/11 0446 10/31/11 0455  WBC 7.6 5.6  HGB 11.5* 10.5*  HCT 33.3* 31.0*  PLT 285 214   BMET  Basename 11/01/11 0446 10/31/11 0455  NA 138 141  K 3.9 4.0  CL 102 105  CO2 27 26  GLUCOSE 80 89  BUN 5* 7  CREATININE 0.66 0.66  CALCIUM 9.4 8.9   PT/INR  Basename 10/31/11 0842  LABPROT 14.0  INR 1.06   ABG No results found for this basename: PHART:2,PCO2:2,PO2:2,HCO3:2 in the last 72 hours  Studies/Results: Ir Perc Nephrostomy Left  10/31/2011  *RADIOLOGY REPORT*  Clinical Data:Left nephrolithiasis, hydronephrosis, leukocytosis.  LEFT PERCUTANEOUS NEPHROSTOMY CATHETER PLACEMENT UNDER ULTRASOUND AND FLUOROSCOPIC GUIDANCE  Technique:  The procedure, risks (including but not limited to bleeding, infection, organ damage), benefits, and alternatives were explained to the patient.  Questions regarding the procedure were encouraged and answered.  The patient understands and consents to the procedure.Leftflank region prepped with Betadine, draped in usual sterile fashion, infiltrated locally with 1% lidocaine.  As antibiotic prophylaxis, Cipro 400 mg was ordered pre-procedure and administered intravenously within one hour of incision.  Intravenous Fentanyl and Versed were administered as conscious sedation  during continuous cardiorespiratory monitoring by the radiology RN, with a total moderate sedation time of 10 minutes.  Under fluoroscopy, the stones evident on prior CT were only faintly visible, therefore I selected ultrasound guidance for initial placement. Under real-time ultrasound guidance, a 21-gauge trocar needle was advanced into a posterior lower pole calyx. Ultrasound image documentation was saved. Urine spontaneously returned through the needle. Needle was exchanged over a guidewire for transitional dilator. Contrast injection confirmed appropriate positioning. Catheter was exchanged over a guidewire for a 10 French pigtail catheter, formed centrally within the left renal collecting system. Contrast injection confirms appropriate positioning and patency. Catheter   secured externally with 0 Prolene suture and placed to external drain bag. No immediate complication.  IMPRESSION Technically successful left percutaneous nephrostomy catheter placement.  Original Report Authenticated By: Osa Craver, M.D.   Ir US Guide Vasc Access Left  10/31/2011  *RADIOLOGY REPORT*  Clinical Data:Left nephrolithiasis, hydronephrosis, leukocytosis.  LEFT PERCUTANEOUS NEPHROSTOMY CATHETER PLACEMENT UNDER ULTRASOUND AND FLUOROSCOPIC GUIDANCE  Technique:  The procedure, risks (including but not limited to bleeding, infection, organ damage), benefits, and alternatives were explained to the patient.  Questions regarding the procedure were encouraged and answered.  The patient understands and consents to the procedure.Leftflank region prepped with Betadine, draped in usual sterile fashion, infiltrated locally with 1% lidocaine.  As antibiotic prophylaxis, Cipro 400 mg was ordered pre-procedure and administered intravenously within one hour of incision.  Intravenous Fentanyl and Versed were administered as conscious sedation during continuous cardiorespiratory monitoring by the radiology RN, with a total moderate  sedation time of 10 minutes.  Under fluoroscopy, the stones  evident on prior CT were only faintly visible, therefore I selected ultrasound guidance for initial placement. Under real-time ultrasound guidance, a 21-gauge trocar needle was advanced into a posterior lower pole calyx. Ultrasound image documentation was saved. Urine spontaneously returned through the needle. Needle was exchanged over a guidewire for transitional dilator. Contrast injection confirmed appropriate positioning. Catheter was exchanged over a guidewire for a 10 French pigtail catheter, formed centrally within the left renal collecting system. Contrast injection confirms appropriate positioning and patency. Catheter   secured externally with 0 Prolene suture and placed to external drain bag. No immediate complication.  IMPRESSION Technically successful left percutaneous nephrostomy catheter placement.  Original Report Authenticated By: Osa Craver, M.D.   Results for orders placed during the hospital encounter of 10/26/11  URINE CULTURE     Status: Normal   Collection Time   10/26/11  3:11 AM      Component Value Range Status Comment   Specimen Description URINE, CLEAN CATCH   Final    Special Requests NONE   Final    Culture  Setup Time [id number]   Final    Colony Count >=100,000 COLONIES/ML   Final    Culture PROTEUS MIRABILIS   Final    Report Status 10/28/2011 FINAL   Final    Organism ID, Bacteria PROTEUS MIRABILIS   Final      Anti-infectives: Anti-infectives     Start     Dose/Rate Route Frequency Ordered Stop   10/31/11 1115   ciprofloxacin (CIPRO) IVPB 400 mg        400 mg 200 mL/hr over 60 Minutes Intravenous  Once 10/31/11 1114 10/31/11 1215   10/28/11 0600   cefTRIAXone (ROCEPHIN) 1 g in dextrose 5 % 50 mL IVPB        1 g 100 mL/hr over 30 Minutes Intravenous Every 24 hours 10/27/11 0608     10/27/11 0630   cefTRIAXone (ROCEPHIN) 1 g in dextrose 5 % 50 mL IVPB        1 g 100 mL/hr over 30  Minutes Intravenous  Once 10/27/11 0624 10/27/11 0701   10/26/11 0345   cefTRIAXone (ROCEPHIN) 1 g in dextrose 5 % 50 mL IVPB  Status:  Discontinued        1 g 100 mL/hr over 30 Minutes Intravenous Every 24 hours 10/26/11 0337 10/26/11 1300          Assessment/Plan: s/p left PCN 2/14, stable; plans as outlined per urology   LOS: 6 days    [name],D [facility] 11/01/2011

## 2011-11-01 NOTE — Plan of Care (Signed)
Problem: Discharge Progression Outcomes Goal: Complications resolved/controlled Outcome: Adequate for Discharge Pt will be leaving with nephrostomy tube

## 2011-11-01 NOTE — Discharge Summary (Signed)
Discharge Summary  Kiera Hussey MR#: [id number]  DOB:Dec 15, 1980  Date of Admission: 10/26/2011 Date of Discharge: 11/01/2011  Patient's PCP: No primary provider on file.  Attending Physician:Hana Trippett  Consults: Treatment Team:  #1 Urology: Marcine Matar, MD   Discharge Diagnoses: Pyelonephritis Present on Admission:  .Pyelonephritis .Tobacco abuse-unspec .Leukocytosis .Kidney stones .UTI (urinary tract infection), bacterial   Brief Admitting History and Physical HPI:  The patient is a pleasant 31 year old am white female who recently moved from [place], [place] and presents with presents with above complaints. She states that she was in her usual state of health are until 2 days ago when she developed an left flank and lower back pain. Despite taking ibuprofen the pain became more severe on day prior to admission she also began having nausea and vomiting-nonbloody. She admits to subjective fevers. She denies cough, diarrhea, melena and no hematochezia. She was seen in the ED and a urinalysis was done and came back consistent with a UTI. She was noted to have a leukocytosis of 22, she was started on empiric IV antibiotics after urine cultures were done. She is admitted for further evaluation and management.  Review of Systems:  The patient denies anorexia, weight loss,, vision loss, decreased hearing, hoarseness, chest pain, syncope, dyspnea on exertion, peripheral edema, balance deficits, hemoptysis, abdominal pain, melena, hematochezia, severe indigestion/heartburn, hematuria, incontinence, muscle weakness, lesions, transient blindness, difficulty walking, depression, unusual weight change, abnormal bleeding, enlarged lymph nodes For the rest of admission history and physical please see H&P dictated by Dr. Suanne Marker.   Discharge Medications Medication List  As of 11/01/2011  1:30 PM   START taking these medications         ciprofloxacin 500 MG tablet   Commonly  known as: CIPRO   Take 1 tablet (500 mg total) by mouth 2 (two) times daily. TAKE FOR 10 DAYS.      DSS 100 MG Caps   Take 100 mg by mouth 2 (two) times daily. TAKE WHILE ON NARCOTIC PAIN MEDS. STOP IF YOU DEVELOP DIARRHEA.      * ibuprofen 800 MG tablet   Commonly known as: ADVIL,MOTRIN   Take 1 tablet (800 mg total) by mouth 3 (three) times daily. TAKE 3 TIMES DAILY FOR 4 DAYS, THEN TAKE EVERY 6 HOURS AS NEEDED.      ondansetron 4 MG tablet   Commonly known as: ZOFRAN   Take 1 tablet (4 mg total) by mouth every 6 (six) hours as needed for nausea.      oxyCODONE 5 MG immediate release tablet   Commonly known as: Oxy IR/ROXICODONE   Take 1 tablet (5 mg total) by mouth every 4 (four) hours as needed.     * Notice: This list has 1 medication(s) that are the same as other medications prescribed for you. Read the directions carefully, and ask your doctor or other care provider to review them with you.       CONTINUE taking these medications         * ibuprofen 800 MG tablet   Commonly known as: ADVIL,MOTRIN     * Notice: This list has 1 medication(s) that are the same as other medications prescribed for you. Read the directions carefully, and ask your doctor or other care provider to review them with you.        Where to get your medications    These are the prescriptions that you need to pick up.   You may get these medications  from any pharmacy.         ciprofloxacin 500 MG tablet   DSS 100 MG Caps   ibuprofen 800 MG tablet   ondansetron 4 MG tablet   oxyCODONE 5 MG immediate release tablet           Hospital Course: Pyelonephritis/left sided hydronephrosis/renal calculi Patient was admitted with findings consistent with acute pyelonephritis. Urine cultures were obtained and patient placed empirically on IV Rocephin. Patient was also placed on anti-medics IV fluids and pain management. Patient during the hospitalization on hospital day 2 continued to have severe left flank  pain with associated nausea and vomiting. Reglan was added to patient's regimen which improved her nausea and vomiting.patient continued to be febrile and a such a renal ultrasound was obtained to rule out abscess formation. Renal ultrasound which was done showed some left-sided mild hydro- nephrosis. Urine cultures which were done did grow Proteus mirabilis which was sensitive to Rocephin. Due to mild hydronephrosis seen on renal ultrasound and a urology consultation was obtained. Patient was seen in consultation by Dr. Retta Diones on 10/29/2011, who recommended CT scan of the abdomen and pelvis stone protocol and anti-inflammatories to aid with the pain management. Patient improved slowly and clinically remained afebrile. CT scan of the abdomen and pelvis which were done did show a sizable left renal pelvic stone layering posteriorly with moderate hydronephrosis and several lower pole stones and a small upper pole stone on the left. It was felt per urology that patient will need adequate drainage of the left renal unit and as such that percutaneous nephrostomy tube was recommended. Interventional radiology was consult is in place the percutaneous nephrostomy tube on 10/31/2011. Patient continued to improve clinically her pain improved on scheduled NSAIDs and oxycodone and IV pain medication when necessary.Patient continued to remain afebrile improved clinically and pain was managed adequately on oral medications. Patient was taught how to empty out the percutaneous drain and will followup with Dr. Retta Diones as outpatient for eventual percutaneous nephrolithotomy. Patient will be discharged home on 10 days of oral ciprofloxacin to complete a two-week course of antibiotic therapy. Patient will be discharged home in stable and improved condition.   Present on Admission:  .Pyelonephritis .Tobacco abuse-unspec .Leukocytosis .Kidney stones .UTI (urinary tract infection), bacterial   Day of Discharge BP 112/76   Pulse 67  Temp(Src) 97.5 F (36.4 C) (Oral)  Resp 18  Ht 5' (1.524 m)  Wt 69.854 kg (154 lb)  BMI 30.08 kg/m2  SpO2 95%  LMP 10/20/2011  Subjective: Pain improved and better control. No nausea no vomiting. Tolerating oral intake. General: Alert, awake, oriented x3, in no acute distress. HEENT: No bruits, no goiter. Heart: Regular rate and rhythm, without murmurs, rubs, gallops. Lungs: Clear to auscultation bilaterally. Abdomen: Soft, nontender, nondistended, positive bowel sounds. Minimal to mild left CVA tenderness to palpation. Extremities: No clubbing cyanosis or edema with positive pedal pulses.   Results for orders placed during the hospital encounter of 10/26/11 (from the past 48 hour(s))  BASIC METABOLIC PANEL     Status: Normal   Collection Time   10/31/11  4:55 AM      Component Value Range Comment   Sodium 141  135 - 145 (mEq/L)    Potassium 4.0  3.5 - 5.1 (mEq/L)    Chloride 105  96 - 112 (mEq/L)    CO2 26  19 - 32 (mEq/L)    Glucose, Bld 89  70 - 99 (mg/dL)    BUN 7  6 - 23 (mg/dL)    Creatinine, Ser 1.61  0.50 - 1.10 (mg/dL)    Calcium 8.9  8.4 - 10.5 (mg/dL)    GFR calc non Af Amer >90  >90 (mL/min)    GFR calc Af Amer >90  >90 (mL/min)   CBC     Status: Abnormal   Collection Time   10/31/11  4:55 AM      Component Value Range Comment   WBC 5.6  4.0 - 10.5 (K/uL)    RBC 3.36 (*) 3.87 - 5.11 (MIL/uL)    Hemoglobin 10.5 (*) 12.0 - 15.0 (g/dL)    HCT 09.6 (*) 04.5 - 46.0 (%)    MCV 92.3  78.0 - 100.0 (fL)    MCH 31.3  26.0 - 34.0 (pg)    MCHC 33.9  30.0 - 36.0 (g/dL)    RDW 40.9  81.1 - 91.4 (%)    Platelets 214  150 - 400 (K/uL)   DIFFERENTIAL     Status: Abnormal   Collection Time   10/31/11  4:55 AM      Component Value Range Comment   Neutrophils Relative 40 (*) 43 - 77 (%)    Lymphocytes Relative 40  12 - 46 (%)    Monocytes Relative 10  3 - 12 (%)    Eosinophils Relative 9 (*) 0 - 5 (%)    Basophils Relative 1  0 - 1 (%)    Neutro Abs 2.2  1.7 -  7.7 (K/uL)    Lymphs Abs 2.2  0.7 - 4.0 (K/uL)    Monocytes Absolute 0.6  0.1 - 1.0 (K/uL)    Eosinophils Absolute 0.5  0.0 - 0.7 (K/uL)    Basophils Absolute 0.1  0.0 - 0.1 (K/uL)    WBC Morphology ATYPICAL LYMPHOCYTES      Smear Review PLATELET COUNT CONFIRMED BY SMEAR   LARGE PLATELETS PRESENT  VITAMIN B12     Status: Normal   Collection Time   10/31/11  4:55 AM      Component Value Range Comment   Vitamin B-12 358  211 - 911 (pg/mL)   FOLATE     Status: Normal   Collection Time   10/31/11  4:55 AM      Component Value Range Comment   Folate 10.3     IRON AND TIBC     Status: Abnormal   Collection Time   10/31/11  4:55 AM      Component Value Range Comment   Iron 35 (*) 42 - 135 (ug/dL)    TIBC 782 (*) 956 - 470 (ug/dL)    Saturation Ratios 21  20 - 55 (%)    UIBC 134  125 - 400 (ug/dL)   FERRITIN     Status: Normal   Collection Time   10/31/11  4:55 AM      Component Value Range Comment   Ferritin 66  10 - 291 (ng/mL)   PROTIME-INR     Status: Normal   Collection Time   10/31/11  8:42 AM      Component Value Range Comment   Prothrombin Time 14.0  11.6 - 15.2 (seconds)    INR 1.06  0.00 - 1.49    APTT     Status: Normal   Collection Time   10/31/11  8:42 AM      Component Value Range Comment   aPTT 32  24 - 37 (seconds)   BASIC METABOLIC PANEL  Status: Abnormal   Collection Time   11/01/11  4:46 AM      Component Value Range Comment   Sodium 138  135 - 145 (mEq/L)    Potassium 3.9  3.5 - 5.1 (mEq/L)    Chloride 102  96 - 112 (mEq/L)    CO2 27  19 - 32 (mEq/L)    Glucose, Bld 80  70 - 99 (mg/dL)    BUN 5 (*) 6 - 23 (mg/dL)    Creatinine, Ser 2.13  0.50 - 1.10 (mg/dL)    Calcium 9.4  8.4 - 10.5 (mg/dL)    GFR calc non Af Amer >90  >90 (mL/min)    GFR calc Af Amer >90  >90 (mL/min)   CBC     Status: Abnormal   Collection Time   11/01/11  4:46 AM      Component Value Range Comment   WBC 7.6  4.0 - 10.5 (K/uL)    RBC 3.64 (*) 3.87 - 5.11 (MIL/uL)    Hemoglobin  11.5 (*) 12.0 - 15.0 (g/dL)    HCT 08.6 (*) 57.8 - 46.0 (%)    MCV 91.5  78.0 - 100.0 (fL)    MCH 31.6  26.0 - 34.0 (pg)    MCHC 34.5  30.0 - 36.0 (g/dL)    RDW 46.9  62.9 - 52.8 (%)    Platelets 285  150 - 400 (K/uL)   DIFFERENTIAL     Status: Abnormal   Collection Time   11/01/11  4:46 AM      Component Value Range Comment   Neutrophils Relative 46  43 - 77 (%)    Lymphocytes Relative 39  12 - 46 (%)    Monocytes Relative 7  3 - 12 (%)    Eosinophils Relative 7 (*) 0 - 5 (%)    Basophils Relative 1  0 - 1 (%)    Neutro Abs 3.5  1.7 - 7.7 (K/uL)    Lymphs Abs 3.0  0.7 - 4.0 (K/uL)    Monocytes Absolute 0.5  0.1 - 1.0 (K/uL)    Eosinophils Absolute 0.5  0.0 - 0.7 (K/uL)    Basophils Absolute 0.1  0.0 - 0.1 (K/uL)    Smear Review MORPHOLOGY UNREMARKABLE       Ct Abdomen Pelvis Wo Contrast  10/29/2011  *RADIOLOGY REPORT*  Clinical Data: Left-sided pain, hematuria, hydronephrosis on ultrasound  CT ABDOMEN AND PELVIS WITHOUT CONTRAST  Technique:  Multidetector CT imaging of the abdomen and pelvis was performed following the standard protocol without intravenous contrast.  Comparison: Ultrasound dated 10/29/2011  Findings: Trace bilateral pleural effusions with associated lower lobe atelectasis.  Unenhanced liver, spleen, pancreas, and adrenal glands within normal limits.  Gallbladder is unremarkable.  No intrahepatic or extrahepatic ductal dilatation.  Right kidney is within normal limits.  The left kidney is notable for at least three nonobstructing calculi measuring up to 6 mm. Left hydronephrosis.  12 mm calculus within a dilated proximal right renal collecting system (coronal image 77).  Mild perinephric/periureteral stranding.  No evidence of bowel obstruction.  Normal appendix.  No evidence of abdominal aortic aneurysm.  Suspicious abdominopelvic lymphadenopathy.  Uterus and bilateral ovaries are unremarkable.  Small volume pelvic ascites, likely physiologic.  No distal ureteral or bladder  calculi.  Visualized osseous structures are within normal limits.  IMPRESSION: 12 mm calculus within a dilated proximal left renal collecting system.  Mild left hydronephrosis with surrounding perinephric/periureteral stranding.  Three additional nonobstructing left renal calculi  measuring up to 6 mm.  Original Report Authenticated By: Charline Bills, M.D.   US Renal  10/29/2011  *RADIOLOGY REPORT*  Clinical Data: Pyelonephritis.  Evaluate for abscess.  RENAL/URINARY TRACT ULTRASOUND COMPLETE  Comparison:  None.  Findings:  Right Kidney:  13.5 cm in length.  Normal renal cortical thickness and echogenicity.  No hydronephrosis or renal lesion.  No perinephric fluid collection.  Left Kidney:  13.7 cm in length.  Mild hydronephrosis.  No renal mass lesion or perinephric fluid collection.  Bladder:  Normal.  Bilateral ureteral jets are noted.  IMPRESSION: Left sided hydronephrosis of uncertain etiology.  No obvious renal mass or abscess.  Original Report Authenticated By: P. Loralie Champagne, M.D.   Ir Perc Nephrostomy Left  10/31/2011  *RADIOLOGY REPORT*  Clinical Data:Left nephrolithiasis, hydronephrosis, leukocytosis.  LEFT PERCUTANEOUS NEPHROSTOMY CATHETER PLACEMENT UNDER ULTRASOUND AND FLUOROSCOPIC GUIDANCE  Technique:  The procedure, risks (including but not limited to bleeding, infection, organ damage), benefits, and alternatives were explained to the patient.  Questions regarding the procedure were encouraged and answered.  The patient understands and consents to the procedure.Leftflank region prepped with Betadine, draped in usual sterile fashion, infiltrated locally with 1% lidocaine.  As antibiotic prophylaxis, Cipro 400 mg was ordered pre-procedure and administered intravenously within one hour of incision.  Intravenous Fentanyl and Versed were administered as conscious sedation during continuous cardiorespiratory monitoring by the radiology RN, with a total moderate sedation time of 10 minutes.  Under  fluoroscopy, the stones evident on prior CT were only faintly visible, therefore I selected ultrasound guidance for initial placement. Under real-time ultrasound guidance, a 21-gauge trocar needle was advanced into a posterior lower pole calyx. Ultrasound image documentation was saved. Urine spontaneously returned through the needle. Needle was exchanged over a guidewire for transitional dilator. Contrast injection confirmed appropriate positioning. Catheter was exchanged over a guidewire for a 10 French pigtail catheter, formed centrally within the left renal collecting system. Contrast injection confirms appropriate positioning and patency. Catheter   secured externally with 0 Prolene suture and placed to external drain bag. No immediate complication.  IMPRESSION Technically successful left percutaneous nephrostomy catheter placement.  Original Report Authenticated By: Osa Craver, M.D.   Ir US Guide Vasc Access Left  10/31/2011  *RADIOLOGY REPORT*  Clinical Data:Left nephrolithiasis, hydronephrosis, leukocytosis.  LEFT PERCUTANEOUS NEPHROSTOMY CATHETER PLACEMENT UNDER ULTRASOUND AND FLUOROSCOPIC GUIDANCE  Technique:  The procedure, risks (including but not limited to bleeding, infection, organ damage), benefits, and alternatives were explained to the patient.  Questions regarding the procedure were encouraged and answered.  The patient understands and consents to the procedure.Leftflank region prepped with Betadine, draped in usual sterile fashion, infiltrated locally with 1% lidocaine.  As antibiotic prophylaxis, Cipro 400 mg was ordered pre-procedure and administered intravenously within one hour of incision.  Intravenous Fentanyl and Versed were administered as conscious sedation during continuous cardiorespiratory monitoring by the radiology RN, with a total moderate sedation time of 10 minutes.  Under fluoroscopy, the stones evident on prior CT were only faintly visible, therefore I selected  ultrasound guidance for initial placement. Under real-time ultrasound guidance, a 21-gauge trocar needle was advanced into a posterior lower pole calyx. Ultrasound image documentation was saved. Urine spontaneously returned through the needle. Needle was exchanged over a guidewire for transitional dilator. Contrast injection confirmed appropriate positioning. Catheter was exchanged over a guidewire for a 10 French pigtail catheter, formed centrally within the left renal collecting system. Contrast injection confirms appropriate positioning and patency. Catheter   secured  externally with 0 Prolene suture and placed to external drain bag. No immediate complication.  IMPRESSION Technically successful left percutaneous nephrostomy catheter placement.  Original Report Authenticated By: Osa Craver, M.D.     Disposition: Home  Diet: General   Activity: increase activity slowly    Follow-up Appts: Discharge Orders    Future Orders Please Complete By Expires   Diet general      Increase activity slowly      Discharge instructions      Comments:   fOLLOW UP WITH d[facility] IN 1 WEEK, UROLOGY FOLLOW UP AT [facility] AS SCHEDULED.   Discharge wound care:      Comments:   GAUZE DRESSING DAILY. EMPTY DRAIN AS INSTRUCTED.      TESTS THAT NEED FOLLOW-UP Repeat UA and for resolution of urinary tract infection.  Time spent on discharge, talking to the patient, and coordinating care: 60 mins.   SignedRamiro Harvest 11/01/2011, 1:30 PM

## 2011-11-19 ENCOUNTER — Other Ambulatory Visit: Payer: Self-pay | Admitting: Urology

## 2011-11-19 DIAGNOSIS — N200 Calculus of kidney: Secondary | ICD-10-CM

## 2011-11-26 ENCOUNTER — Encounter (HOSPITAL_COMMUNITY): Payer: Self-pay | Admitting: Pharmacy Technician

## 2011-11-28 ENCOUNTER — Other Ambulatory Visit: Payer: Self-pay | Admitting: Physician Assistant

## 2011-12-02 ENCOUNTER — Encounter (HOSPITAL_COMMUNITY): Payer: Self-pay

## 2011-12-02 ENCOUNTER — Encounter (HOSPITAL_COMMUNITY)
Admission: RE | Admit: 2011-12-02 | Discharge: 2011-12-02 | Disposition: A | Discharge status: Home / Self Care | Payer: Self-pay | Source: Ambulatory Visit | Attending: Urology | Admitting: Urology

## 2011-12-02 HISTORY — DX: Calculus of kidney: N20.0

## 2011-12-02 LAB — BASIC METABOLIC PANEL
CO2: 25 mEq/L (ref 19–32)
Calcium: 9.4 mg/dL (ref 8.4–10.5)
Chloride: 103 mEq/L (ref 96–112)
Glucose, Bld: 90 mg/dL (ref 70–99)
Potassium: 4.2 mEq/L (ref 3.5–5.1)
Sodium: 137 mEq/L (ref 135–145)

## 2011-12-02 LAB — CBC
Hemoglobin: 12.9 g/dL (ref 12.0–15.0)
MCH: 31.6 pg (ref 26.0–34.0)
MCV: 93.6 fL (ref 78.0–100.0)
Platelets: 275 10*3/uL (ref 150–400)
RBC: 4.08 MIL/uL (ref 3.87–5.11)
WBC: 9.4 10*3/uL (ref 4.0–10.5)

## 2011-12-02 LAB — HCG, SERUM, QUALITATIVE: Preg, Serum: NEGATIVE

## 2011-12-02 NOTE — Patient Instructions (Signed)
21 Stormy Connon  12/02/2011   Your procedure is scheduled on:  12-06-11  Report to [facility] at 0800  AM.  Call this number if you have problems the morning of surgery: [phone]   Remember:   Do not eat food or drink liquids:After Midnight.  .  Take these medicines the morning of surgery with A SIP OF WATER: no meds to take   Do not wear jewelry or make up.  Do not wear lotions, powders, or perfumes.Do not wear deodorant.    Do not bring valuables to the hospital.  Contacts, dentures or bridgework may not be worn into surgery.  Leave suitcase in the car. After surgery it may be brought to your room.  For patients admitted to the hospital, checkout time is 11:00 AM the day of discharge.     Special Instructions: Hydrologist Wash: 1/2 bottle night before surgery and 1/2 bottle morning of surgery.neck down avoid private area, no shaving 2 days before showers   Please read over the following fact sheets that you were given: MRSA Information  Cain S[facility] pre op nurse phone number [phone], call if needed

## 2011-12-03 ENCOUNTER — Other Ambulatory Visit: Payer: Self-pay | Admitting: Radiology

## 2011-12-04 NOTE — H&P (Addendum)
History of Present Illness        31 YO female seen today as a post hospital consult follow-up. Was seen by Dr. Retta Diones 10/26/11 for consult for left hydronephrosis, left renal calculus, and pyelonephritis. CT revealed a 7 x 11.6 mm left renal pelvic stone and a 14.5 mm left lower pole stone(s). The stones were faintly visible on fluoroscopy. and a Left PCN tube placed by IR in the posterior lower pole calyx. She was discharged home with Cipro 500 mg 1 po BID X 10 days. Her urine culture grew Proteus sensitive to Ancef and Cipro.  Today, her only complaint is some vaginal discharge and itching. She wonders if she developed a yeast infection from the Cipro. She has no fevers chills, nausea or vomiting. She is having normal bowel movements. She has no dysuria. She just completed Cipro.   Past Medical History Problems  1. History of  Nephrolithiasis V13.01  Surgical History Problems  1. History of  Tubal Ligation V25.2  Current Meds 1. No Reported Medications  Allergies Medication  1. Codeine Derivatives 2. Toradol SOLN 3. Ultram TABS  Family History Problems  1. Family history of  Family Health Status - Father's Age age 50 2. Family history of  Family Health Status - Mother's Age age 106 3. Family history of  Family Health Status Number Of Children 2 sons 1 daughter 4. Paternal history of  Nephrolithiasis  Social History Problems    Caffeine Use 2 coffee per day   Current Smoker 305.1 smokes 1ppd for 73yrs   Marital History - Separated   Unemployed Denied    History of  Alcohol Use  Review of Systems Constitutional, skin, eye, otolaryngeal, hematologic/lymphatic, cardiovascular, pulmonary, endocrine, musculoskeletal, gastrointestinal, neurological and psychiatric system(s) were reviewed and pertinent findings if present are noted.  Gastrointestinal: nausea, vomiting, abdominal pain and diarrhea.  Musculoskeletal: back pain.  Psychiatric: depression and anxiety.      Vitals Vital Signs [Data Includes: Last 1 Day]  04Mar2013 01:25PM  BMI Calculated: 29.45 BSA Calculated: 1.65 Height: 5 ft  Weight: 150 lb  Blood Pressure: 101 / 62 Temperature: 98.5 F Heart Rate: 62  Physical Exam Constitutional: Well nourished and well developed . No acute distress.  Pulmonary: No respiratory distress and normal respiratory rhythm and effort.  Cardiovascular: Heart rate and rhythm are normal . No peripheral edema.  Abdomen: No CVA tenderness.  Left nephrostomy tube in place draining clear yellow urine.    Assessment Assessed  1. Hydronephrosis On The Left 591 2. Nephrolithiasis Of The Left Kidney 592.0 3. Vaginal Candidiasis 112.1  Plan  Health Maintenance (V70.0)  1. UA With REFLEX  Done: 04Mar2013 Hydronephrosis On The Left (591), Nephrolithiasis Of The Left Kidney (592.0)  2. Follow-up Schedule Surgery Office  Follow-up  Requested for: 04Mar2013 Vaginal Candidiasis (112.1)  3. Fluconazole 150 MG Oral Tablet; TAKE 1 TABLET 1 TIME ONLY; Therapy: 04Mar2013 to  (Evaluate:05Mar2013); Last Rx:04Mar2013  URINE CULTURE  Status: In Progress - Specimen/Data Collected  Done: 04Mar2013 Ordered Today; For: Hydronephrosis On The Left (591), Nephrolithiasis Of The Left Kidney (592.0); Ordered By: Jerilee Field  PerformLoney Loh  Order Comments: Nephrostomy culture  Due: 06Mar2013 Marked Important; Last Updated By: Larena Sox   Discussion/Summary  I discussed with the patient the findings on her CT. We discussed given the stone in her left renal pelvis and left lower pole she has significant stone burden. We discussed the nature risks and benefits of no treatment, left extracorporeal shockwave lithotripsy, ureteroscopy  laser lithotripsy, percutaneous nephrolithotomy. I used the [facility] kidney stone brochure which nicely illustrates each procedure and goes over the risk of each procedure. All questions answered. She elects to proceed with left percutaneous  nephrolithotomy. Urine was sent for culture from her bladder and the nephrostomy tube. I will give her some Diflucan.   H&P update Pt seen and examined. Chart reviewed. She has been well. No dysuria or F/C. Urine Cx from bladder and Nx were negative. She is here with her parents. We discussed again the nature, R/B and alternatives to left PCNL including non-treatment, URS/laser and ESWL. All questions answered. We discussed possibility of multiple procedures post-op Nx tube and/or ureteral stent and importance of F/u. She elects to proceed with Left PCNL.   IR images reviewed - stones in renal pelvis, LP stones may have migrated to UPJ.

## 2011-12-05 ENCOUNTER — Telehealth (HOSPITAL_COMMUNITY): Payer: Self-pay | Admitting: Urology

## 2011-12-06 ENCOUNTER — Encounter (HOSPITAL_COMMUNITY): Payer: Self-pay | Admitting: *Deleted

## 2011-12-06 ENCOUNTER — Ambulatory Visit (HOSPITAL_COMMUNITY): Payer: Self-pay | Admitting: Anesthesiology

## 2011-12-06 ENCOUNTER — Encounter (HOSPITAL_COMMUNITY): Payer: Self-pay | Admitting: Anesthesiology

## 2011-12-06 ENCOUNTER — Ambulatory Visit (HOSPITAL_COMMUNITY)
Admission: RE | Admit: 2011-12-06 | Discharge: 2011-12-08 | Disposition: A | Discharge status: Home / Self Care | Payer: Self-pay | Source: Ambulatory Visit | Attending: Urology | Admitting: Urology

## 2011-12-06 ENCOUNTER — Encounter (HOSPITAL_COMMUNITY)
Admission: RE | Disposition: A | Discharge status: Home / Self Care | Payer: Self-pay | Source: Ambulatory Visit | Attending: Urology

## 2011-12-06 ENCOUNTER — Ambulatory Visit (HOSPITAL_COMMUNITY)
Admission: RE | Admit: 2011-12-06 | Discharge: 2011-12-06 | Disposition: A | Discharge status: Home / Self Care | Payer: Self-pay | Source: Ambulatory Visit | Attending: Urology | Admitting: Urology

## 2011-12-06 ENCOUNTER — Ambulatory Visit (HOSPITAL_COMMUNITY): Payer: Self-pay

## 2011-12-06 ENCOUNTER — Other Ambulatory Visit: Payer: Self-pay | Admitting: Urology

## 2011-12-06 DIAGNOSIS — N200 Calculus of kidney: Secondary | ICD-10-CM

## 2011-12-06 DIAGNOSIS — N1330 Unspecified hydronephrosis: Secondary | ICD-10-CM | POA: Insufficient documentation

## 2011-12-06 HISTORY — PX: NEPHROLITHOTOMY: SHX5134

## 2011-12-06 LAB — CBC
HCT: 35.7 % — ABNORMAL LOW (ref 36.0–46.0)
Hemoglobin: 12 g/dL (ref 12.0–15.0)
MCH: 31.5 pg (ref 26.0–34.0)
MCHC: 33.6 g/dL (ref 30.0–36.0)

## 2011-12-06 LAB — BASIC METABOLIC PANEL
BUN: 9 mg/dL (ref 6–23)
Calcium: 8.8 mg/dL (ref 8.4–10.5)
GFR calc non Af Amer: >90 mL/min (ref >90)
Glucose, Bld: 113 mg/dL — ABNORMAL HIGH (ref 70–99)

## 2011-12-06 SURGERY — NEPHROLITHOTOMY PERCUTANEOUS
Anesthesia: General | Site: Back | Laterality: Left | Wound class: Clean Contaminated

## 2011-12-06 MED ORDER — FLUCONAZOLE IN SODIUM CHLORIDE 200-0.9 MG/100ML-% IV SOLN
200.0000 mg | Freq: Once | INTRAVENOUS | Status: AC
  Administered 2011-12-06: 200 mg via INTRAVENOUS
  Filled 2011-12-06 (×2): qty 100

## 2011-12-06 MED ORDER — LACTATED RINGERS IV SOLN
INTRAVENOUS | Status: DC | PRN
  Administered 2011-12-06 (×2): via INTRAVENOUS

## 2011-12-06 MED ORDER — HYDROCODONE-ACETAMINOPHEN 5-325 MG PO TABS
1.0000 | ORAL_TABLET | ORAL | Status: DC | PRN
  Administered 2011-12-06 – 2011-12-07 (×5): 2 via ORAL
  Filled 2011-12-06 (×5): qty 2

## 2011-12-06 MED ORDER — MIDAZOLAM HCL 5 MG/5ML IJ SOLN
INTRAMUSCULAR | Status: AC | PRN
  Administered 2011-12-06 (×2): 2 mg via INTRAVENOUS

## 2011-12-06 MED ORDER — LIDOCAINE HCL (CARDIAC) 20 MG/ML IV SOLN
INTRAVENOUS | Status: DC | PRN
  Administered 2011-12-06: 60 mg via INTRAVENOUS

## 2011-12-06 MED ORDER — DIPHENHYDRAMINE HCL 50 MG/ML IJ SOLN
INTRAMUSCULAR | Status: AC
  Filled 2011-12-06: qty 1

## 2011-12-06 MED ORDER — LACTATED RINGERS IV SOLN
INTRAVENOUS | Status: AC
  Administered 2011-12-06: 17:00:00 via INTRAVENOUS

## 2011-12-06 MED ORDER — LACTATED RINGERS IV SOLN
INTRAVENOUS | Status: DC
Start: 2011-12-06 — End: 2011-12-06
  Administered 2011-12-06: 16:00:00 via INTRAVENOUS

## 2011-12-06 MED ORDER — METRONIDAZOLE IN NACL 5-0.79 MG/ML-% IV SOLN
500.0000 mg | Freq: Two times a day (BID) | INTRAVENOUS | Status: AC
  Administered 2011-12-07 (×2): 500 mg via INTRAVENOUS
  Filled 2011-12-06 (×3): qty 100

## 2011-12-06 MED ORDER — DOCUSATE SODIUM 100 MG PO CAPS: 100.0000 mg | ORAL_CAPSULE | Freq: Two times a day (BID) | ORAL | Status: AC

## 2011-12-06 MED ORDER — IOHEXOL 300 MG/ML  SOLN
10.0000 mL | Freq: Once | INTRAMUSCULAR | Status: AC | PRN
  Administered 2011-12-06: 10 mL

## 2011-12-06 MED ORDER — ACETAMINOPHEN 10 MG/ML IV SOLN
INTRAVENOUS | Status: DC | PRN
  Administered 2011-12-06: 1000 mg via INTRAVENOUS

## 2011-12-06 MED ORDER — FENTANYL CITRATE 0.05 MG/ML IJ SOLN: 50.0000 ug | INTRAMUSCULAR | Status: DC | PRN

## 2011-12-06 MED ORDER — ONDANSETRON HCL 4 MG/2ML IJ SOLN
4.0000 mg | INTRAMUSCULAR | Status: DC | PRN
  Administered 2011-12-07: 4 mg via INTRAVENOUS
  Filled 2011-12-06: qty 2

## 2011-12-06 MED ORDER — SCOPOLAMINE 1 MG/3DAYS TD PT72
MEDICATED_PATCH | TRANSDERMAL | Status: DC | PRN
  Administered 2011-12-06: 1 via TRANSDERMAL

## 2011-12-06 MED ORDER — CEFAZOLIN SODIUM 1-5 GM-% IV SOLN
1.0000 g | INTRAVENOUS | Status: AC
  Administered 2011-12-06: 1 g via INTRAVENOUS

## 2011-12-06 MED ORDER — HYDROMORPHONE HCL PF 1 MG/ML IJ SOLN
0.5000 mg | INTRAMUSCULAR | Status: DC | PRN
  Administered 2011-12-06 – 2011-12-07 (×7): 1 mg via INTRAVENOUS
  Filled 2011-12-06 (×7): qty 1

## 2011-12-06 MED ORDER — CIPROFLOXACIN IN D5W 400 MG/200ML IV SOLN
400.0000 mg | Freq: Once | INTRAVENOUS | Status: AC
  Administered 2011-12-06 (×2): 400 mg via INTRAVENOUS

## 2011-12-06 MED ORDER — MIDAZOLAM HCL 2 MG/2ML IJ SOLN
INTRAMUSCULAR | Status: AC
  Filled 2011-12-06: qty 4

## 2011-12-06 MED ORDER — ROCURONIUM BROMIDE 100 MG/10ML IV SOLN
INTRAVENOUS | Status: DC | PRN
  Administered 2011-12-06 (×3): 10 mg via INTRAVENOUS
  Administered 2011-12-06: 40 mg via INTRAVENOUS
  Administered 2011-12-06: 10 mg via INTRAVENOUS

## 2011-12-06 MED ORDER — NEOSTIGMINE METHYLSULFATE 1 MG/ML IJ SOLN
INTRAMUSCULAR | Status: DC | PRN
  Administered 2011-12-06: 4 mg via INTRAVENOUS

## 2011-12-06 MED ORDER — DIPHENHYDRAMINE HCL 12.5 MG/5ML PO ELIX
12.5000 mg | ORAL_SOLUTION | Freq: Four times a day (QID) | ORAL | Status: DC | PRN
  Administered 2011-12-07: 12.5 mg via ORAL
  Filled 2011-12-06 (×2): qty 5

## 2011-12-06 MED ORDER — DROPERIDOL 2.5 MG/ML IJ SOLN
INTRAMUSCULAR | Status: DC | PRN
  Administered 2011-12-06: 0.625 mg via INTRAVENOUS

## 2011-12-06 MED ORDER — FENTANYL CITRATE 0.05 MG/ML IJ SOLN
INTRAMUSCULAR | Status: AC
  Filled 2011-12-06: qty 2

## 2011-12-06 MED ORDER — CEPHALEXIN 500 MG PO CAPS: 500.0000 mg | ORAL_CAPSULE | Freq: Three times a day (TID) | ORAL | Status: AC

## 2011-12-06 MED ORDER — MIDAZOLAM HCL 5 MG/5ML IJ SOLN
INTRAMUSCULAR | Status: DC | PRN
  Administered 2011-12-06: 2 mg via INTRAVENOUS

## 2011-12-06 MED ORDER — IOHEXOL 300 MG/ML  SOLN
INTRAMUSCULAR | Status: DC | PRN
  Administered 2011-12-06: 50 mL

## 2011-12-06 MED ORDER — SODIUM CHLORIDE 0.9 % IR SOLN
Status: DC | PRN
  Administered 2011-12-06: 9000 mL

## 2011-12-06 MED ORDER — MEPERIDINE HCL 50 MG/ML IJ SOLN: 6.2500 mg | INTRAMUSCULAR | Status: DC | PRN

## 2011-12-06 MED ORDER — PROMETHAZINE HCL 25 MG/ML IJ SOLN
INTRAMUSCULAR | Status: DC | PRN
  Administered 2011-12-06: 25 mg via INTRAVENOUS

## 2011-12-06 MED ORDER — NICOTINE 21 MG/24HR TD PT24
21.0000 mg | MEDICATED_PATCH | Freq: Every day | TRANSDERMAL | Status: DC
  Administered 2011-12-06 – 2011-12-08 (×3): 21 mg via TRANSDERMAL
  Filled 2011-12-06 (×6): qty 1

## 2011-12-06 MED ORDER — FENTANYL CITRATE 0.05 MG/ML IJ SOLN
INTRAMUSCULAR | Status: DC | PRN
  Administered 2011-12-06: 100 ug via INTRAVENOUS
  Administered 2011-12-06: 50 ug via INTRAVENOUS
  Administered 2011-12-06: 100 ug via INTRAVENOUS

## 2011-12-06 MED ORDER — IOHEXOL 300 MG/ML  SOLN
INTRAMUSCULAR | Status: AC
  Filled 2011-12-06: qty 2

## 2011-12-06 MED ORDER — FENTANYL CITRATE 0.05 MG/ML IJ SOLN
50.0000 ug | INTRAMUSCULAR | Status: DC | PRN
  Administered 2011-12-06: 50 ug via INTRAVENOUS

## 2011-12-06 MED ORDER — DEXAMETHASONE SODIUM PHOSPHATE 4 MG/ML IJ SOLN
INTRAMUSCULAR | Status: DC | PRN
  Administered 2011-12-06: 10 mg via INTRAVENOUS

## 2011-12-06 MED ORDER — OXYCODONE-ACETAMINOPHEN 5-325 MG PO TABS: 1.0000 | ORAL_TABLET | ORAL | Status: AC | PRN

## 2011-12-06 MED ORDER — DIPHENHYDRAMINE HCL 50 MG/ML IJ SOLN
12.5000 mg | Freq: Four times a day (QID) | INTRAMUSCULAR | Status: DC | PRN
  Administered 2011-12-06: 12.5 mg via INTRAVENOUS

## 2011-12-06 MED ORDER — GLYCOPYRROLATE 0.2 MG/ML IJ SOLN
INTRAMUSCULAR | Status: DC | PRN
  Administered 2011-12-06: .6 mg via INTRAVENOUS

## 2011-12-06 MED ORDER — DOCUSATE SODIUM 100 MG PO CAPS
100.0000 mg | ORAL_CAPSULE | Freq: Two times a day (BID) | ORAL | Status: DC
  Administered 2011-12-06 – 2011-12-08 (×4): 100 mg via ORAL
  Filled 2011-12-06 (×7): qty 1

## 2011-12-06 MED ORDER — HYDROMORPHONE HCL PF 1 MG/ML IJ SOLN: 0.2500 mg | INTRAMUSCULAR | Status: DC | PRN

## 2011-12-06 MED ORDER — HYDROMORPHONE HCL PF 1 MG/ML IJ SOLN
INTRAMUSCULAR | Status: DC | PRN
  Administered 2011-12-06: 0.5 mg via INTRAVENOUS
  Administered 2011-12-06 (×2): 1 mg via INTRAVENOUS
  Administered 2011-12-06 (×2): .5 mg via INTRAVENOUS
  Administered 2011-12-06: 0.5 mg via INTRAVENOUS

## 2011-12-06 MED ORDER — ACETAMINOPHEN 10 MG/ML IV SOLN
INTRAVENOUS | Status: AC
  Filled 2011-12-06: qty 100

## 2011-12-06 MED ORDER — FENTANYL CITRATE 0.05 MG/ML IJ SOLN
INTRAMUSCULAR | Status: AC | PRN
  Administered 2011-12-06 (×2): 100 ug via INTRAVENOUS

## 2011-12-06 MED ORDER — PROMETHAZINE HCL 25 MG/ML IJ SOLN: 6.2500 mg | INTRAMUSCULAR | Status: DC | PRN

## 2011-12-06 MED ORDER — ACETAMINOPHEN 325 MG PO TABS: 650.0000 mg | ORAL_TABLET | ORAL | Status: DC | PRN

## 2011-12-06 MED ORDER — CEFTRIAXONE SODIUM 1 G IJ SOLR
1.0000 g | INTRAMUSCULAR | Status: DC
  Administered 2011-12-06 – 2011-12-07 (×2): 1 g via INTRAVENOUS
  Filled 2011-12-06 (×3): qty 10

## 2011-12-06 MED ORDER — MIDAZOLAM HCL 2 MG/2ML IJ SOLN
INTRAMUSCULAR | Status: AC
  Filled 2011-12-06: qty 2

## 2011-12-06 MED ORDER — PROPOFOL 10 MG/ML IV BOLUS
INTRAVENOUS | Status: DC | PRN
  Administered 2011-12-06: 150 mg via INTRAVENOUS

## 2011-12-06 MED ORDER — SODIUM CHLORIDE 0.9 % IV SOLN
INTRAVENOUS | Status: DC
  Administered 2011-12-06: 10:00:00 via INTRAVENOUS

## 2011-12-06 MED ORDER — LIDOCAINE HCL 1 % IJ SOLN
INTRAMUSCULAR | Status: AC
  Filled 2011-12-06: qty 20

## 2011-12-06 MED ORDER — FENTANYL CITRATE 0.05 MG/ML IJ SOLN
INTRAMUSCULAR | Status: AC
  Administered 2011-12-06: 50 ug
  Filled 2011-12-06: qty 6

## 2011-12-06 SURGICAL SUPPLY — 44 items
BAG URINE DRAINAGE (UROLOGICAL SUPPLIES) ×2 IMPLANT
BASKET STONE NITINOL 3FRX115MB (UROLOGICAL SUPPLIES) IMPLANT
BASKET ZERO TIP NITINOL 2.4FR (BASKET) ×2 IMPLANT
BENZOIN TINCTURE PRP APPL 2/3 (GAUZE/BANDAGES/DRESSINGS) ×2 IMPLANT
CATH FOLEY 2W COUNCIL 20FR 5CC (CATHETERS) IMPLANT
CATH FOLEY 2W COUNCIL 5CC 18FR (CATHETERS) ×2 IMPLANT
CATH ROBINSON RED A/P 20FR (CATHETERS) IMPLANT
CATH URET DUAL LUMEN 6-10FR 50 (CATHETERS) ×2 IMPLANT
CATH X-FORCE N30 NEPHROSTOMY (TUBING) ×2 IMPLANT
CLOTH BEACON ORANGE TIMEOUT ST (SAFETY) ×2 IMPLANT
COVER SURGICAL LIGHT HANDLE (MISCELLANEOUS) ×2 IMPLANT
DRAPE C-ARM 42X72 X-RAY (DRAPES) ×2 IMPLANT
DRAPE CAMERA CLOSED 9X96 (DRAPES) ×2 IMPLANT
DRAPE LINGEMAN PERC (DRAPES) ×2 IMPLANT
DRAPE SURG IRRIG POUCH 19X23 (DRAPES) ×2 IMPLANT
DRAPE UTILITY XL STRL (DRAPES) ×2 IMPLANT
DRSG TEGADERM 8X12 (GAUZE/BANDAGES/DRESSINGS) ×4 IMPLANT
GLOVE BIOGEL M STRL SZ7.5 (GLOVE) ×2 IMPLANT
GOWN STRL REIN XL XLG (GOWN DISPOSABLE) ×2 IMPLANT
GUIDEWIRE STR DUAL SENSOR (WIRE) ×2 IMPLANT
KIT BASIN OR (CUSTOM PROCEDURE TRAY) ×2 IMPLANT
LASER FIBER DISP (UROLOGICAL SUPPLIES) IMPLANT
LASER FIBER DISP 1000U (UROLOGICAL SUPPLIES) IMPLANT
MANIFOLD NEPTUNE II (INSTRUMENTS) ×2 IMPLANT
NS IRRIG 1000ML POUR BTL (IV SOLUTION) ×2 IMPLANT
PACK BASIC VI WITH GOWN DISP (CUSTOM PROCEDURE TRAY) ×2 IMPLANT
PAD ABD 7.5X8 STRL (GAUZE/BANDAGES/DRESSINGS) ×4 IMPLANT
PROBE EHL 9 FR 470CM (MISCELLANEOUS) IMPLANT
PROBE LITHOCLAST ULTRA 3.8X403 (UROLOGICAL SUPPLIES) ×2 IMPLANT
PROBE PNEUMATIC 1.0MMX570MM (UROLOGICAL SUPPLIES) ×2 IMPLANT
SET IRRIG Y TYPE TUR BLADDER L (SET/KITS/TRAYS/PACK) ×2 IMPLANT
SET WARMING FLUID IRRIGATION (MISCELLANEOUS) ×2 IMPLANT
SPONGE GAUZE 4X4 12PLY (GAUZE/BANDAGES/DRESSINGS) ×2 IMPLANT
SPONGE LAP 4X18 X RAY DECT (DISPOSABLE) ×2 IMPLANT
STENT ENDOURETEROTOMY 7-14 26C (STENTS) IMPLANT
STONE CATCHER W/TUBE ADAPTER (UROLOGICAL SUPPLIES) ×2 IMPLANT
SUT SILK 2 0 30  PSL (SUTURE) ×1
SUT SILK 2 0 30 PSL (SUTURE) ×1 IMPLANT
SYR 20CC LL (SYRINGE) ×4 IMPLANT
SYRINGE 10CC LL (SYRINGE) ×2 IMPLANT
TOWEL OR NON WOVEN STRL DISP B (DISPOSABLE) ×2 IMPLANT
TRAY FOLEY CATH 14FRSI W/METER (CATHETERS) ×2 IMPLANT
TUBING CONNECTING 10 (TUBING) ×6 IMPLANT
WATER STERILE IRR 1500ML POUR (IV SOLUTION) ×2 IMPLANT

## 2011-12-06 NOTE — Anesthesia Postprocedure Evaluation (Signed)
Anesthesia Post Note  Patient: [name]  Procedure(s) Performed: Procedure(s) (LRB): NEPHROLITHOTOMY PERCUTANEOUS (Left)  Anesthesia type: General  Patient location: PACU  Post pain: Pain level controlled  Post assessment: Post-op Vital signs reviewed  Last Vitals:  Filed Vitals:   12/06/11 1600  BP: 97/39  Pulse: 63  Temp: 36.5 C  Resp: 13    Post vital signs: Reviewed  Level of consciousness: sedated  Complications: No apparent anesthesia complications

## 2011-12-06 NOTE — Discharge Instructions (Signed)
Percutaneous Nephrolithotomy Kidney stones can cause a great deal of pain. They can block urine from leaving the body. And they can lead to infection. Kidney stones might pass on their own, or they can be broken up by shock waves from a special machine. But sometimes surgery is needed to get rid of kidney stones. One type of surgery is called percutaneous nephrolithotomy. "Nephro" means kidney, "litho" means stone and "tomy" means removal by surgery. "Percutaneous" means through the skin. This type of surgery needs only a small cut (incision) in the skin. This surgery may be suggested for various reasons. They include:  The kidney stones are 2 centimeters wide (about 3/4 inch) or bigger. They also might be oddly shaped.   Infection had developed and a nephrostomy tube was placed RISKS AND COMPLICATIONS   During surgery:   Sometimes all the kidney stones cannot be removed through the tube. Then, the percutaneous nephrolithotomy procedure would be stopped. Other procedures may be needed.  Short-term risks from the surgery could include:   Excessive bleeding.   Blood in your urine.   Holes in the kidney. These usually heal on their own.   Pain.   Redness or tenderness at the incision site.   Numbness (loss of feeling) in the area treated. Tingling also is possible.   A pooling of blood in the wound (hematoma).   Infection.   Slow healing.   Longer-term possibilities include:   Kidney damage.   Damage to organs near the kidney.   Need for a repeat surgery.  PROCEDURE  The preparation:   You will change into a hospital gown.   You will be given an IV. A needle will be inserted in your arm. Medication will be able to flow directly into your body through this needle.   You might be given a sedative. This medication will help you relax.   You may be given a general anesthetic (a drug that will put you to sleep during the surgery). Or, you may get a local anesthetic (part of  your body will be numb, but you will remain awake).   A catheter (tube) will be put in your bladder to drain urine during and after surgery.   The procedure:   The surgeon will make a small incision in your lower back.   A tube will be inserted through the incision into your kidney.   Each kidney stone is removed through this tube. Larger stones may first be broken up with a laser (a high-intensity light beam) or other tools.   If a kidney stone has already left the kidney, the surgeon would use a special tool to bring it back in. Then it would be removed through the tube.   After all the stones are taken out, a catheter will be put in. Fluid can build up around the kidney as it heals. The catheter lets this fluid drain out of the body.    A ureteral stent was NOT placed  A dressing (medicine and bandage) will be put on the incision area.   The surgery usually takes three to four hours.  AFTER THE PROCEDURE  You will stay in a recovery area until the anesthesia has worn off. Your blood pressure and pulse will be checked often. You might be given more pain medication.   You will be moved to a hospital room for the rest of your stay.   The catheter that is taking urine out of your body will be taken out  within 24 hours.   The day after your surgery, you should be able to walk around. Walking helps prevent blood clots (thick clumps that can block the flow of blood).   You may be asked to do some breathing exercises.   You will be able to have only liquids for a day or two.   Before you go home:   The catheter that is draining fluid from the kidney area is usually taken out.   You will be taught how to care for the incision. Be sure to ask how often the dressing should be changed and when it can get wet.   You also will be told what you should and should not do while your kidney and incisions heal. For instance, you may be urged to walk to prevent blood clots.  Document  Released: 06/30/2009 Document Revised: 08/22/2011 Document Reviewed: 06/30/2009 [facility] Patient Information 2012 [place], [place].

## 2011-12-06 NOTE — Procedures (Signed)
Successful exchange of left nephrostomy for nephroureteral catheter.   No immediate complication.

## 2011-12-06 NOTE — Anesthesia Preprocedure Evaluation (Addendum)
Anesthesia Evaluation  Patient identified by MRN, date of birth, ID band Patient awake    Reviewed: Allergy & Precautions, H&P , NPO status , Patient's Chart, lab work & pertinent test results  Airway Mallampati: II TM Distance: >3 FB Neck ROM: Full    Dental No notable dental hx. (+) Dental Advisory Given, Poor Dentition and Edentulous Upper   Pulmonary neg pulmonary ROS, Current Smoker,  breath sounds clear to auscultation  Pulmonary exam normal       Cardiovascular negative cardio ROS  Rhythm:Regular Rate:Normal     Neuro/Psych negative neurological ROS  negative psych ROS   GI/Hepatic negative GI ROS, Neg liver ROS,   Endo/Other  negative endocrine ROS  Renal/GU negative Renal ROS  negative genitourinary   Musculoskeletal negative musculoskeletal ROS (+)   Abdominal   Peds negative pediatric ROS (+)  Hematology negative hematology ROS (+)   Anesthesia Other Findings   Reproductive/Obstetrics negative OB ROS                           Anesthesia Physical Anesthesia Plan  ASA: II  Anesthesia Plan: General   Post-op Pain Management:    Induction: Intravenous  Airway Management Planned:   Additional Equipment:   Intra-op Plan:   Post-operative Plan: Extubation in OR  Informed Consent: I have reviewed the patients History and Physical, chart, labs and discussed the procedure including the risks, benefits and alternatives for the proposed anesthesia with the patient or authorized representative who has indicated his/her understanding and acceptance.   Dental advisory given  Plan Discussed with: CRNA  Anesthesia Plan Comments:         Anesthesia Quick Evaluation

## 2011-12-06 NOTE — Transfer of Care (Signed)
Immediate Anesthesia Transfer of Care Note  Patient: [name]  Procedure(s) Performed: Procedure(s) (LRB): NEPHROLITHOTOMY PERCUTANEOUS (Left)  Patient Location: PACU  Anesthesia Type: General  Level of Consciousness: awake, alert  and patient cooperative  Airway & Oxygen Therapy: Patient Spontanous Breathing and Patient connected to face mask oxygen  Post-op Assessment: Report given to PACU RN, Post -op Vital signs reviewed and stable and Patient moving all extremities X 4  Post vital signs: Reviewed and stable  Complications: No apparent anesthesia complications

## 2011-12-06 NOTE — H&P (Signed)
[name] is an 31 y.o. female.   Chief Complaint: left kidney stone HPI: Patient with history of left nephrolithiasis/hydronephrosis and prior left nephrostomy in 10/2011 presents today for left nephroureteral cath placement prior to nephrolithotomy.  Past Medical History  Diagnosis Date  . Bipolar disorder     not on meds  . Nephrolithiasis     left     Past Surgical History  Procedure Date  . Left nephrostomy Oct 29, 2010  . Tubal ligation 2008    No family history on file. Social History:  reports that she has been smoking Cigarettes.  She has a 21 pack-year smoking history. She has never used smokeless tobacco. She reports that she drinks alcohol. She reports that she does not use illicit drugs.  Allergies:  Allergies  Allergen Reactions  . Toradol Rash  . Ultram (Tramadol Hcl) Other (See Comments)    Heart speeds up  . Codeine Nausea And Vomiting and Rash    Medications Prior to Admission  Medication Sig Dispense Refill  . ibuprofen (ADVIL,MOTRIN) 800 MG tablet Take 800 mg by mouth every 8 (eight) hours as needed. For pain       Medications Prior to Admission  Medication Dose Route Frequency Provider Last Rate Last Dose  . 0.9 %  sodium chloride infusion   Intravenous Continuous Abundio Miu, MD      . ceFAZolin (ANCEF) IVPB 1 g/50 mL premix  1 g Intravenous 30 min Pre-Op Antony Haste, MD      . ciprofloxacin (CIPRO) IVPB 400 mg  400 mg Intravenous Once Abundio Miu, MD      . fentaNYL (SUBLIMAZE) 0.05 MG/ML injection           . fluconazole (DIFLUCAN) IVPB 200 mg  200 mg Intravenous Once Antony Haste, MD      . midazolam (VERSED) 2 MG/2ML injection           . midazolam (VERSED) 2 MG/2ML injection            Results for orders placed during the hospital encounter of 12/02/11  SURGICAL PCR SCREEN      Component Value Range   MRSA, PCR NEGATIVE  NEGATIVE    Staphylococcus aureus NEGATIVE  NEGATIVE   HCG, SERUM, QUALITATIVE   Component Value Range   Preg, Serum NEGATIVE  NEGATIVE   BASIC METABOLIC PANEL      Component Value Range   Sodium 137  135 - 145 (mEq/L)   Potassium 4.2  3.5 - 5.1 (mEq/L)   Chloride 103  96 - 112 (mEq/L)   CO2 25  19 - 32 (mEq/L)   Glucose, Bld 90  70 - 99 (mg/dL)   BUN 8  6 - 23 (mg/dL)   Creatinine, Ser 1.61  0.50 - 1.10 (mg/dL)   Calcium 9.4  8.4 - 09.6 (mg/dL)   GFR calc non Af Amer >90  >90 (mL/min)   GFR calc Af Amer >90  >90 (mL/min)  CBC      Component Value Range   WBC 9.4  4.0 - 10.5 (K/uL)   RBC 4.08  3.87 - 5.11 (MIL/uL)   Hemoglobin 12.9  12.0 - 15.0 (g/dL)   HCT 04.5  40.9 - 81.1 (%)   MCV 93.6  78.0 - 100.0 (fL)   MCH 31.6  26.0 - 34.0 (pg)   MCHC 33.8  30.0 - 36.0 (g/dL)   RDW 91.4  78.2 - 95.6 (%)   Platelets 275  150 - 400 (K/uL)     Review of Systems  Constitutional: Negative for fever and chills.  Respiratory: Negative for cough and shortness of breath.   Cardiovascular: Negative for chest pain.  Gastrointestinal: Negative for nausea, vomiting and abdominal pain.  Genitourinary: Positive for flank pain. Negative for dysuria and hematuria.  Neurological: Negative for headaches.  Endo/Heme/Allergies: Does not bruise/bleed easily.    Blood pressure 104/64, pulse 81, temperature 98.4 F (36.9 C), temperature source Oral, resp. rate 12, height 5' (1.524 m), weight 159 lb (72.122 kg), last menstrual period 11/25/2011, SpO2 96.00%. Physical Exam  Constitutional: She is oriented to person, place, and time. She appears well-developed and well-nourished.  Cardiovascular: Normal rate and regular rhythm.   Respiratory: Effort normal and breath sounds normal.  GI: Soft. Bowel sounds are normal. She exhibits no distension.       Intact left nephrostomy draining yellow urine; area mildly tender to palpation  Musculoskeletal: Normal range of motion. She exhibits no edema.  Neurological: She is alert and oriented to person, place, and time.      Assessment/Plan Patient with history of left nephrolithiasis/left hydronephrosis and existing left nephrostomy; plan is for left nephroureteral catheter placement prior to left nephrolithotomy.Details /risks of procedure d/w pt.  Ailany Koren,D KEVIN 12/06/2011, 9:32 AM

## 2011-12-06 NOTE — Brief Op Note (Addendum)
12/06/2011  2:44 PM  PATIENT:  [name]  31 y.o. female  PRE-OPERATIVE DIAGNOSIS:  left nephrolithiasis   POST-OPERATIVE DIAGNOSIS:  left nephrolithiasis   PROCEDURE:  Procedure(s) (LRB): NEPHROLITHOTOMY PERCUTANEOUS (Left)  SURGEON:  Surgeon(s) and Role:    * Antony Haste, MD - Primary    * Marcine Matar, MD - Assisting  ANESTHESIA:   general  EBL: 50 ml  BLOOD ADMINISTERED:none  DRAINS: Urinary Catheter (Foley) , Left nephrostomy tube  SPECIMEN: stone for analysis  DISPOSITION OF SPECIMEN:  PATHOLOGY - clinic  COUNTS:  correct  DICTATION: [id number]  PLAN OF CARE: Admit for overnight observation  PATIENT DISPOSITION:  PACU - hemodynamically stable.   Delay start of Pharmacological VTE agent (>24hrs) due to surgical blood loss or risk of bleeding: YES

## 2011-12-07 MED ORDER — OXYCODONE HCL 5 MG PO TABS
5.0000 mg | ORAL_TABLET | ORAL | Status: DC | PRN
  Administered 2011-12-07 – 2011-12-08 (×2): 5 mg via ORAL
  Filled 2011-12-07 (×2): qty 1

## 2011-12-07 MED ORDER — ACETAMINOPHEN 10 MG/ML IV SOLN
1000.0000 mg | Freq: Four times a day (QID) | INTRAVENOUS | Status: AC
  Administered 2011-12-07 – 2011-12-08 (×2): 1000 mg via INTRAVENOUS
  Filled 2011-12-07 (×4): qty 100

## 2011-12-07 MED ORDER — FLUCONAZOLE 150 MG PO TABS
150.0000 mg | ORAL_TABLET | Freq: Once | ORAL | Status: AC
  Administered 2011-12-07: 150 mg via ORAL
  Filled 2011-12-07: qty 1

## 2011-12-07 NOTE — Progress Notes (Signed)
Page to  Dr. Rae Halsted regarding pt's back "spasms", reconnected to gravity drainage bag and yellow-orange urine return. Pt given ordered pain medications. Will observe.

## 2011-12-07 NOTE — Progress Notes (Signed)
1 Day Post-Op Subjective: Patient reports that she is still having a fair amount of pain. She's had no fever or chills. Complaining of " milky vaginal discharge"  Objective: Vital signs in last 24 hours: Temp:  [97.2 F (36.2 C)-98.4 F (36.9 C)] 97.9 F (36.6 C) (03/23 0216) Pulse Rate:  [11-88] 52  (03/23 0216) Resp:  [8-19] 16  (03/23 0216) BP: (93-130)/(39-67) 100/59 mmHg (03/23 0216) SpO2:  [96 %-100 %] 99 % (03/23 0216) Weight:  [70.761 kg (156 lb)-72.122 kg (159 lb)] 70.761 kg (156 lb) (03/22 1629)  Intake/Output from previous day: 03/22 0701 - 03/23 0700 In: 4148.3 [P.O.:660; I.V.:3388.3; IV Piggyback:100] Out: 3421 [Urine:3420; Emesis/NG output:1] Intake/Output this shift: Total I/O In: 1238.3 [I.V.:1138.3; IV Piggyback:100] Out: 3001 [Urine:3000; Emesis/NG output:1]  Physical Exam:  Constitutional: Vital signs reviewed. WD WN in NAD    Abdominal: Soft. Mild to moderate left flank tenderness. Her dressing is dry present time, but has been changed.   Lab Results:  Basename 12/06/11 1702  HGB 12.0  HCT 35.7*   BMET  Basename 12/06/11 1702  NA 135  K 4.0  CL 102  CO2 25  GLUCOSE 113*  BUN 9  CREATININE 0.59  CALCIUM 8.8   No results found for this basename: LABPT:3,INR:3 in the last 72 hours No results found for this basename: LABURIN:1 in the last 72 hours Results for orders placed during the hospital encounter of 12/02/11  SURGICAL PCR SCREEN     Status: Normal   Collection Time   12/02/11  9:00 AM      Component Value Range Status Comment   MRSA, PCR NEGATIVE  NEGATIVE  Final    Staphylococcus aureus NEGATIVE  NEGATIVE  Final     Studies/Results: Dg C-arm 61-120 Min-no Report  12/06/2011  CLINICAL DATA: intra op   C-ARM 61-120 MINUTES  Fluoroscopy was utilized by the requesting physician.  No radiographic  interpretation.     Ir [place] Abts Cath Perc Left  12/06/2011  *RADIOLOGY REPORT*  Clinical history:31 year old with history of left renal  calculi. The patient has an existing left nephrostomy tube.  Scheduled for a nephrolithotomy procedure.  Needs conversion to a nephroureteral catheter prior to going to the operating room.  PROCEDURE(S): CONVERSION OF NEPHROSTOMY TUBE TO NEPHROURETERAL CATHETER  Physician: Rachelle Hora. Henn, MD  Medications:Versed  4 mg, Fentanyl 200 mcg. A radiology nurse monitored the patient for moderate sedation.  Ciprofloxacin 400 mg IV.  Ciprofloxacin was given within two hours of incision.  Moderate sedation time:20 minutes  Fluoroscopy time: 3.4 minutes  Procedure:The procedure was explained to the patient.  The risks and benefits of the procedure were discussed and the patient's questions were addressed.  Informed consent was obtained from the patient.  The patient was placed prone.  The left flank was prepped and draped in a sterile fashion.   Maximal barrier sterile technique was utilized including caps, mask, sterile gowns, sterile gloves, sterile drape, hand hygiene and skin antiseptic.   Contrast was injected through the nephrostomy tube.  Catheter was cut and removed over a Bentson wire.  A 5-French Kumpe catheter was advanced into the left ureter using a Glidewire.  Catheter was advanced into the urinary bladder.  Catheter sutured to the skin.  Findings:Nephrostomy tube enters through the left lower pole calix. Filling defects in the left renal pelvis are consistent with stones.  Contrast drains into the bladder.  Complications: None  Impression:Successful conversion of the left nephrostomy tube for a nephroureteral catheter.  Original Report Authenticated By: Richarda Overlie, M.D.    Assessment/Plan:   Postoperative day #1 from percutaneous nephrostomy on the left. She seems to be doing well. Still having a fair amount of pain.    I will remove her Foley catheter, clubbing or nephrostomy tube, add Diflucan, and add IV Tylenol, decreasing her IV narcotic administration.   LOS: 1 day   Chelsea Aus 12/07/2011, 6:29 AM

## 2011-12-07 NOTE — Progress Notes (Signed)
Pt's nephrostomy tube dressing to left flank  was saturated, MD made aware, new orders received to change pt's dressing. Will continue to monitor pt.  S.Jerrian Mells RN

## 2011-12-08 LAB — CBC
MCH: 31.5 pg (ref 26.0–34.0)
MCHC: 33.4 g/dL (ref 30.0–36.0)
Platelets: 167 10*3/uL (ref 150–400)

## 2011-12-08 MED ORDER — DSS 100 MG PO CAPS: 100.0000 mg | ORAL_CAPSULE | Freq: Two times a day (BID) | ORAL | Status: AC

## 2011-12-08 NOTE — Op Note (Signed)
NAMEMARIALUISA, [name]             ACCOUNT NO.:  [id number]  MEDICAL RECORD NO.:  [id number]  LOCATION:  1425                          FACILITY:  [facility]  PHYSICIAN:  Jerilee Field, MD   DATE OF BIRTH:  03-30-1981  DATE OF PROCEDURE:  12/06/2011 DATE OF DISCHARGE:                              OPERATIVE REPORT   PREOPERATIVE DIAGNOSIS:  Left nephrolithiasis.  POSTOPERATIVE DIAGNOSIS:  Left nephrolithiasis.  PROCEDURE:  Left percutaneous nephrolithotomy.  SURGEON:  Jerilee Field, MD  ASSISTANT SURGEON:  Bertram Millard. Dahlstedt, M.D.  ANESTHESIA:  General.  INDICATION FOR PROCEDURE:  [name] is a 31 year old female who presented in urosepsis with an obstructing left UPJ stone.  She also had a large stone in the left lower pole of the left kidney.  A nephrostomy tube was placed, and she improved.  Urine cultures grew out E. coli. She was then seen in the office, and I discussed findings with the patient, her stone came to be close to 3 cm in length.  A culture from the bladder and the nephrostomy were negative.  We discussed the nature, risks and benefits, and alternatives to left percutaneous nephrolithotomy.  All questions were answered and she elected to proceed.  FINDINGS:  On the preop interventional films and in the OR, the lower pole stone had moved into the renal pelvis adjacent to the other large renal pelvic stone.  Both stones were grasped intact and removed, they were quite soft.  An antegrade nephrostogram after stone removal indicated no extravasation of contrast and excellent drainage through the ureter to the bladder.  DESCRIPTION OF PROCEDURE:  After consent was obtained, the patient was brought to the operating room.  A time-out was performed to confirm the patient and procedure.  SCDs and a Foley catheter were placed.  She also had an OG tube after adequate anesthesia.  She was then placed prone and all pressure points were padded.  The left nephrostomy  tube was prepped and draped in the usual sterile fashion.  A superstiff wire was advanced down and coiled in the bladder under fluoroscopic guidance, and the Kumpe catheter removed.  A dual-lumen exchange catheter was then advanced to the mid-ureter and a second superstiff wire was placed.  The dual-lumen was removed and the skin was incised.  A 30-French balloon dilator was then advanced into the lower pole calix after shooting an antegrade nephrostogram.  The sheath was then advanced into the left lower pole calix and visualization with the nephroscope was undertaken. The sheath was just at the edge of the infundibulum with the nephroscope guided into the lower pole calix, which was noted to be free of stone. The infundibulum was also free of stone.  In the renal pelvis again were the 2 large stones visualized.  The rigid graspers were passed and grabbed 1 of the stones and it was removed.  The nephroscope was then advanced with the sheath, and backed out slightly and it was difficult to find the passage back in, therefore a flexible cystoscope was advanced and able to be advanced up the helix infundibulum back into the renal pelvis.  A Nitinol basket was then deployed and the other stone was grasped  in the basket and removed intact.  The cystoscope was then advanced back into the renal pelvis.  The upper pole was inspected, it was noted to be stone free.  The middle pole was difficult to visualize and the lower pole and lower pole infundibula were free of stone.  There was a small fragment in the renal pelvis, this was grasped with the basket, it simply broke apart into small pieces.  Both stones were soft. Through the cystoscope an antegrade nephrostogram was performed, which showed normal collecting system without extravasation and excellent antegrade passage of contrast down to the bladder without obstruction. Therefore the cystoscope was removed and the sheath was removed.   The working wire had been removed, so over the safety wire, the dual-lumen was passed.  A second wire was then advanced down the ureter and coiled in the bladder.  Over the working wire, that was replaced, an 18-French Councill tip catheter was placed with the tip of the catheter in the renal pelvis to serve as a nephrostomy tube.  Two cc were placed in the balloon, which was visualized traversing the infundibulum.  The nephrostomy tube was then secured to the patient's skin.  It irrigated normally and was left to gravity drainage.  She was cleaned up and a dressing placed.  She was then placed supine and taken recovery room in stable condition.  COMPLICATIONS:  None.  ESTIMATED BLOOD LOSS:  50 mL.  DRAINS:  Foley catheter and left nephrostomy tube.  SPECIMEN:  Stone for analysis to Pathology.  DISPOSITION:  Patient stable to PACU.          ______________________________ Jerilee Field, MD     ME/MEDQ  D:  12/06/2011  T:  12/07/2011  Job:  [id number]

## 2011-12-08 NOTE — Progress Notes (Signed)
2 Days Post-Op Subjective: Patient report that she is feeling a bit better. Her tube was actually planned for quite a few hours yesterday before she had pain. Objective: Vital signs in last 24 hours: Temp:  [97.7 F (36.5 C)-98.3 F (36.8 C)] 97.7 F (36.5 C) (03/23 2240) Pulse Rate:  [51-65] 65  (03/23 2240) Resp:  [16-18] 16  (03/23 2240) BP: (95-104)/(54-66) 101/64 mmHg (03/23 2240) SpO2:  [97 %-99 %] 98 % (03/23 2240)  Intake/Output from previous day: 03/23 0701 - 03/24 0700 In: 1090 [P.O.:840; IV Piggyback:250] Out: 2475 [Urine:2475] Intake/Output this shift: Total I/O In: -  Out: 975 [Urine:975]  Physical Exam:  Constitutional: Vital signs reviewed. WD WN in NAD  . She is resting comfortably   Lab Results:  Basename 12/06/11 1702  HGB 12.0  HCT 35.7*   BMET  Basename 12/06/11 1702  NA 135  K 4.0  CL 102  CO2 25  GLUCOSE 113*  BUN 9  CREATININE 0.59  CALCIUM 8.8   No results found for this basename: LABPT:3,INR:3 in the last 72 hours No results found for this basename: LABURIN:1 in the last 72 hours Results for orders placed during the hospital encounter of 12/02/11  SURGICAL PCR SCREEN     Status: Normal   Collection Time   12/02/11  9:00 AM      Component Value Range Status Comment   MRSA, PCR NEGATIVE  NEGATIVE  Final    Staphylococcus aureus NEGATIVE  NEGATIVE  Final     Studies/Results: Dg C-arm 61-120 Min-no Report  12/06/2011  CLINICAL DATA: intra op   C-ARM 61-120 MINUTES  Fluoroscopy was utilized by the requesting physician.  No radiographic  interpretation.     Ir [place] Abts Cath Perc Left  12/06/2011  *RADIOLOGY REPORT*  Clinical history:31 year old with history of left renal calculi. The patient has an existing left nephrostomy tube.  Scheduled for a nephrolithotomy procedure.  Needs conversion to a nephroureteral catheter prior to going to the operating room.  PROCEDURE(S): CONVERSION OF NEPHROSTOMY TUBE TO NEPHROURETERAL CATHETER   Physician: Rachelle Hora. Henn, MD  Medications:Versed  4 mg, Fentanyl 200 mcg. A radiology nurse monitored the patient for moderate sedation.  Ciprofloxacin 400 mg IV.  Ciprofloxacin was given within two hours of incision.  Moderate sedation time:20 minutes  Fluoroscopy time: 3.4 minutes  Procedure:The procedure was explained to the patient.  The risks and benefits of the procedure were discussed and the patient's questions were addressed.  Informed consent was obtained from the patient.  The patient was placed prone.  The left flank was prepped and draped in a sterile fashion.   Maximal barrier sterile technique was utilized including caps, mask, sterile gowns, sterile gloves, sterile drape, hand hygiene and skin antiseptic.   Contrast was injected through the nephrostomy tube.  Catheter was cut and removed over a Bentson wire.  A 5-French Kumpe catheter was advanced into the left ureter using a Glidewire.  Catheter was advanced into the urinary bladder.  Catheter sutured to the skin.  Findings:Nephrostomy tube enters through the left lower pole calix. Filling defects in the left renal pelvis are consistent with stones.  Contrast drains into the bladder.  Complications: None  Impression:Successful conversion of the left nephrostomy tube for a nephroureteral catheter.  Original Report Authenticated By: Richarda Overlie, M.D.    Assessment/Plan:   Postoperative day #2 left-sided percutaneous nephrolithotomy. She is actually stable.    We will try plugging and to begin today. If she tolerates  this, I will try to remove this afternoon, and possibly g go home. Hemoglobin was checked this morning, results are pending.   LOS: 2 days   Marcine Matar M 12/08/2011, 6:05 AM

## 2011-12-08 NOTE — Progress Notes (Signed)
Pt verb understanding of dc instructions. She is awaiting transportation home.

## 2011-12-08 NOTE — Discharge Summary (Signed)
Patient ID: [name] MRN: [id number] DOB/AGE: 1980/09/19 31 y.o.  Admit date: 12/06/2011 Discharge date: 12/08/2011  Primary Care Physician:  No primary provider on file.  Discharge Diagnoses:  Left renal calculi  Consults:  None    Discharge Medications: Medication List  As of 12/08/2011  2:00 PM   STOP taking these medications         ibuprofen 800 MG tablet         TAKE these medications         cephALEXin 500 MG capsule   Commonly known as: KEFLEX   Take 1 capsule (500 mg total) by mouth 3 (three) times daily.      docusate sodium 100 MG capsule   Commonly known as: COLACE   Take 1 capsule (100 mg total) by mouth 2 (two) times daily.      oxyCODONE-acetaminophen 5-325 MG per tablet   Commonly known as: PERCOCET   Take 1 tablet by mouth every 4 (four) hours as needed for pain.             Significant Diagnostic Studies:  Dg C-arm 61-120 Min-no Report  12/06/2011  CLINICAL DATA: intra op   C-ARM 61-120 MINUTES  Fluoroscopy was utilized by the requesting physician.  No radiographic  interpretation.     Ir [place] Abts Cath Perc Left  12/06/2011  *RADIOLOGY REPORT*  Clinical history:31 year old with history of left renal calculi. The patient has an existing left nephrostomy tube.  Scheduled for a nephrolithotomy procedure.  Needs conversion to a nephroureteral catheter prior to going to the operating room.  PROCEDURE(S): CONVERSION OF NEPHROSTOMY TUBE TO NEPHROURETERAL CATHETER  Physician: Rachelle Hora. Henn, MD  Medications:Versed  4 mg, Fentanyl 200 mcg. A radiology nurse monitored the patient for moderate sedation.  Ciprofloxacin 400 mg IV.  Ciprofloxacin was given within two hours of incision.  Moderate sedation time:20 minutes  Fluoroscopy time: 3.4 minutes  Procedure:The procedure was explained to the patient.  The risks and benefits of the procedure were discussed and the patient's questions were addressed.  Informed consent was obtained from the patient.  The  patient was placed prone.  The left flank was prepped and draped in a sterile fashion.   Maximal barrier sterile technique was utilized including caps, mask, sterile gowns, sterile gloves, sterile drape, hand hygiene and skin antiseptic.   Contrast was injected through the nephrostomy tube.  Catheter was cut and removed over a Bentson wire.  A 5-French Kumpe catheter was advanced into the left ureter using a Glidewire.  Catheter was advanced into the urinary bladder.  Catheter sutured to the skin.  Findings:Nephrostomy tube enters through the left lower pole calix. Filling defects in the left renal pelvis are consistent with stones.  Contrast drains into the bladder.  Complications: None  Impression:Successful conversion of the left nephrostomy tube for a nephroureteral catheter.  Original Report Authenticated By: Richarda Overlie, M.D.    Brief H and P: For complete details please refer to admission H and P, but in brief the patient was admitted for percutaneous nephrostomy placement and percutaneous nephrolithotomy for symptomatic left renal calculi.  Hospital Course:  The patient underwent operative removal of left renal calculi through percutaneous approach on 12/06/2011. She had a relatively uncomplicated postoperative course. She did have low tolerance for pain, and used a fair amount of several different pain medicines, especially for the first 36 hours. By the second postoperative day, she tolerated her nephrostomy tube being plugged, and it was removed at a  1400 hours. At that point, she was discharged.pproximately  Day of Discharge BP 99/60  Pulse 63  Temp(Src) 96.6 F (35.9 C) (Oral)  Resp 16  Ht 5' (1.524 m)  Wt 70.761 kg (156 lb)  BMI 30.47 kg/m2  SpO2 100%  Results for orders placed during the hospital encounter of 12/06/11 (from the past 24 hour(s))  CBC     Status: Abnormal   Collection Time   12/08/11  7:52 AM      Component Value Range   WBC 11.1 (*) 4.0 - 10.5 (K/uL)   RBC 3.46  (*) 3.87 - 5.11 (MIL/uL)   Hemoglobin 10.9 (*) 12.0 - 15.0 (g/dL)   HCT 16.1 (*) 09.6 - 46.0 (%)   MCV 94.2  78.0 - 100.0 (fL)   MCH 31.5  26.0 - 34.0 (pg)   MCHC 33.4  30.0 - 36.0 (g/dL)   RDW 04.5  40.9 - 81.1 (%)   Platelets 167  150 - 400 (K/uL)    Physical Exam: General: Alert and awake oriented x3 not in any acute distress. HEENT: anicteric sclera, pupils reactive to light and accommodation CVS: S1-S2 clear no murmur rubs or gallops Chest: clear to auscultation bilaterally, no wheezing rales or rhonchi Abdomen: soft nontender, nondistended, normal bowel sounds, no organomegaly Extremities: no cyanosis, clubbing or edema noted bilaterally Neuro: Cranial nerves II-XII intact, no focal neurological deficits  Disposition: Home  Diet:  no restrictions  Activity:  gradually increase    Disposition and Follow-up:    she will followup in 7-10 days in our office  TESTS THAT NEED FOLLOW-UP   DISCHARGE FOLLOW-UP Follow-up Information    Follow up with [facility], NP. (7-10 days)    Contact information:   [address] [facility] [place] [address] [phone]          Time spent on Discharge:  10 minutes   Signed: Chelsea Aus 12/08/2011, 2:00 PM

## 2011-12-08 NOTE — Progress Notes (Signed)
Pt nephrostomy tube plugged at 8am. She is currently resting quietly, denies pain.

## 2011-12-08 NOTE — Progress Notes (Signed)
Pt continues to await transportation home. States her sister get off work shortly and will come pick her up.

## 2011-12-13 ENCOUNTER — Encounter (HOSPITAL_COMMUNITY): Payer: Self-pay | Admitting: Urology

## 2013-03-05 NOTE — Telephone Encounter (Signed)
e

## 2013-03-27 IMAGING — CT CT ABD-PELV W/O CM
2 of 4 series · 17 of 46 positions shown, 19 images · non-contrast
Comparison: Ultrasound dated 10/29/2011

CLINICAL DATA: Left-sided pain, hematuria, hydronephrosis on
ultrasound

CT ABDOMEN AND PELVIS WITHOUT CONTRAST
TECHNIQUE: Multidetector CT imaging of the abdomen and pelvis was
performed following the standard protocol without intravenous
contrast.

[Series 2: rtn a/p w/o · axial · non-contrast · 0.77mm/px · z∈[-342,+58]mm · 14 of 88 slices shown, 16 images]
[im 4/88  soft-tissue]
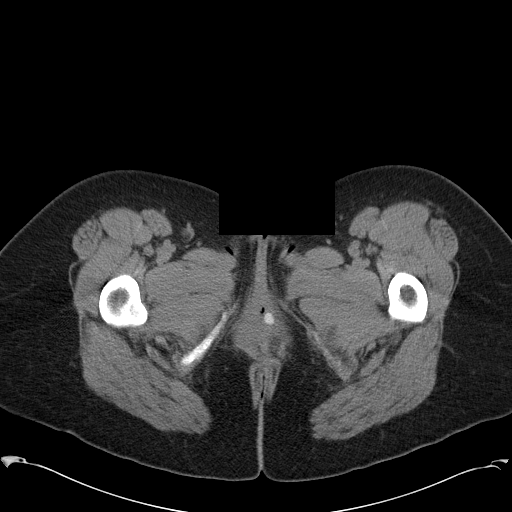
[im 4/88  bone]
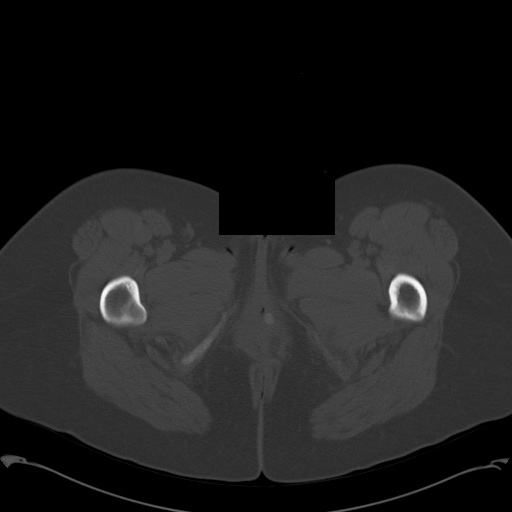
[im 12/88  soft-tissue]
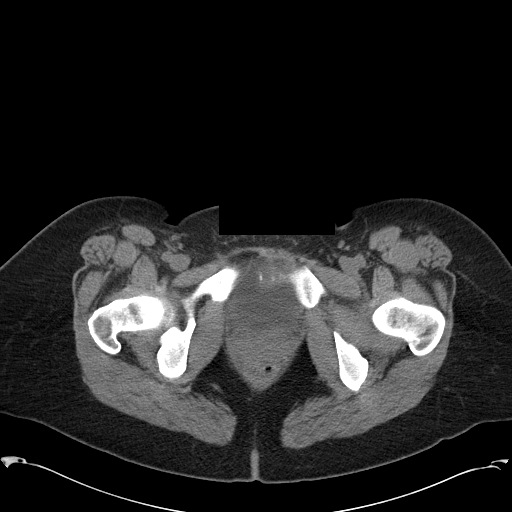
[im 16/88  soft-tissue]
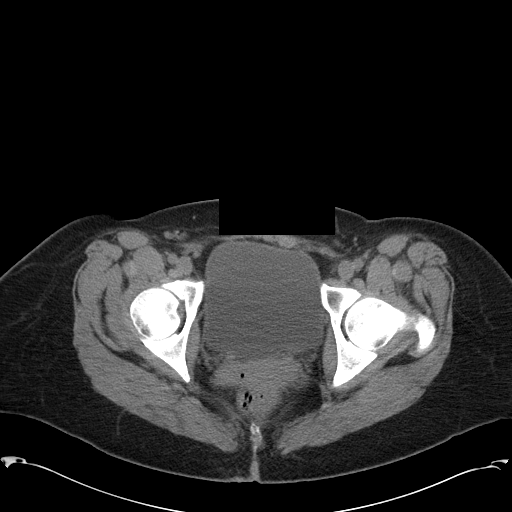
[im 23/88  soft-tissue]
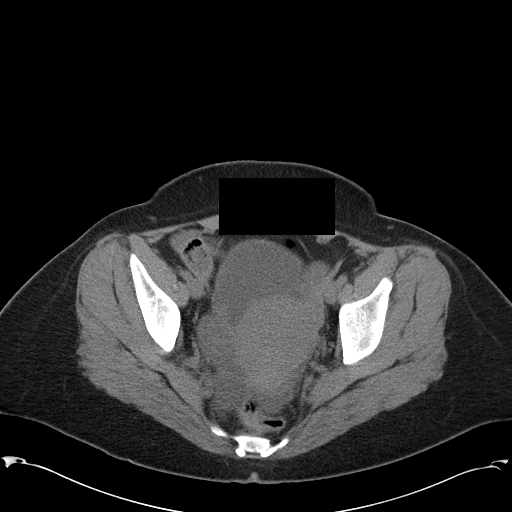
[im 31/88  soft-tissue]
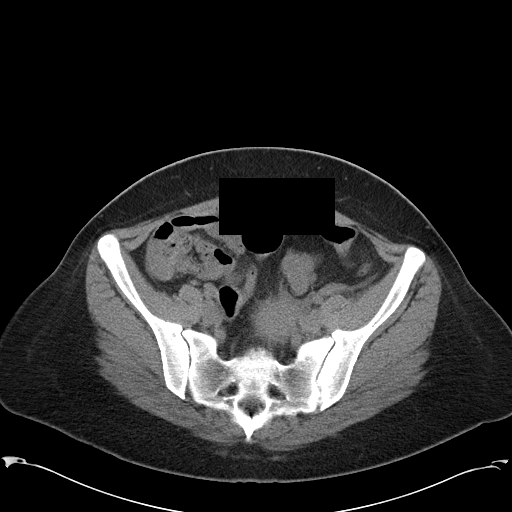
[im 35/88  soft-tissue]
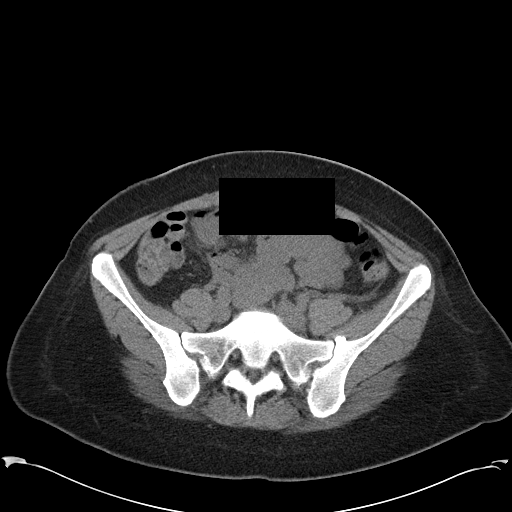
[im 42/88  soft-tissue]
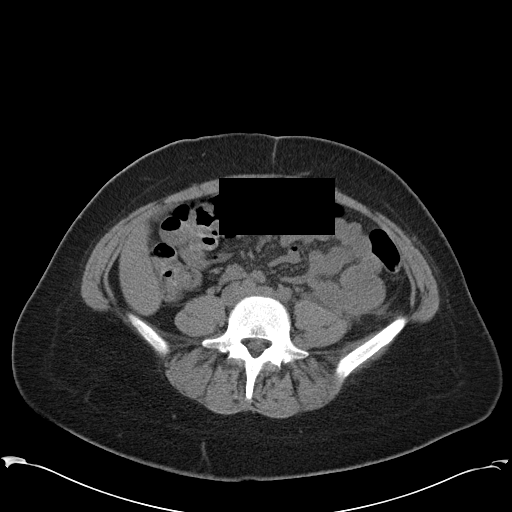
[im 46/88  soft-tissue]
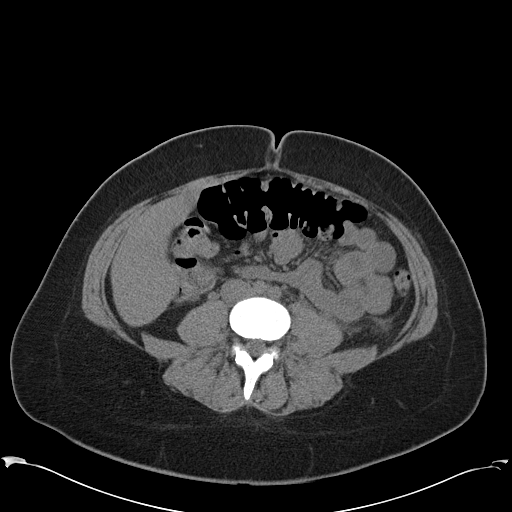
[im 53/88  soft-tissue]
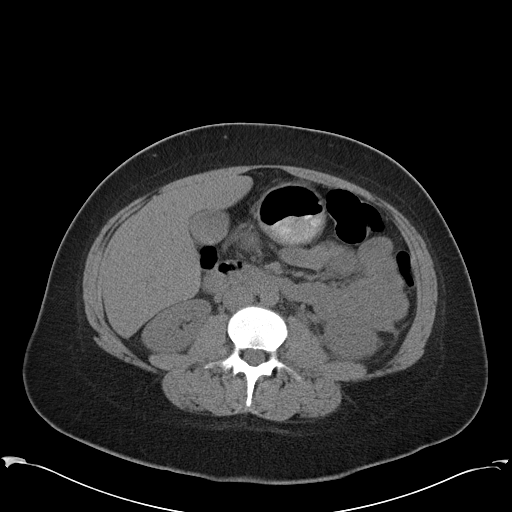
[im 53/88  bone]
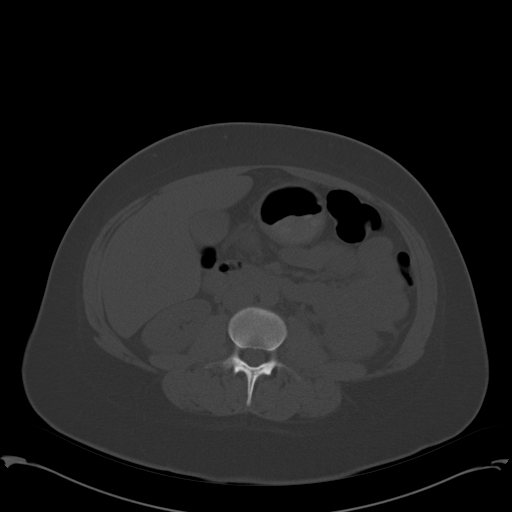
[im 57/88  soft-tissue]
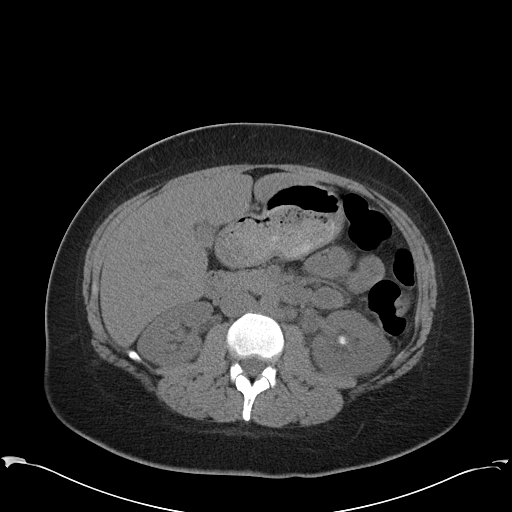
[im 65/88  soft-tissue]
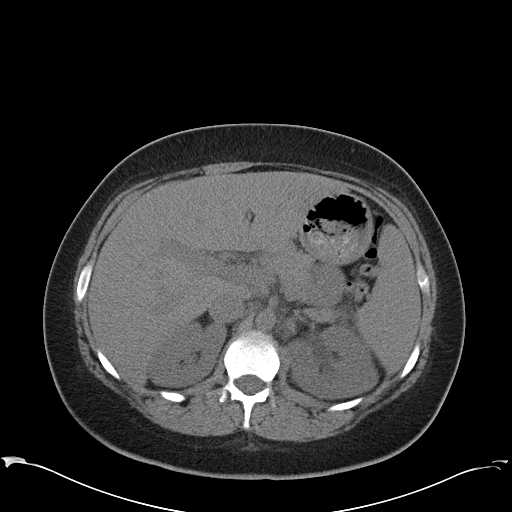
[im 72/88  soft-tissue]
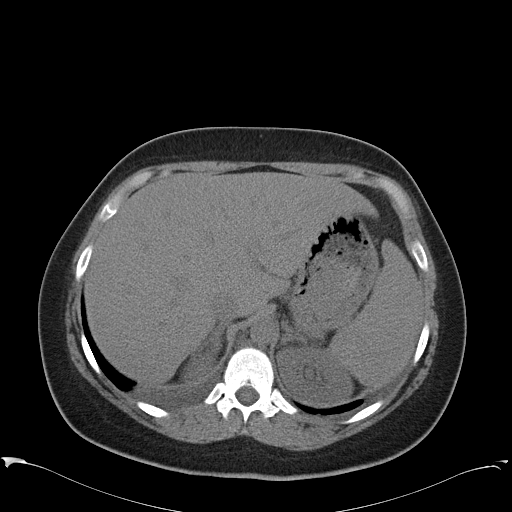
[im 76/88  soft-tissue]
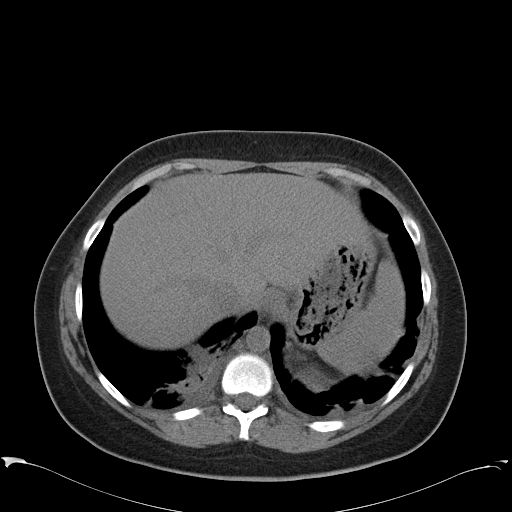
[im 84/88  soft-tissue]
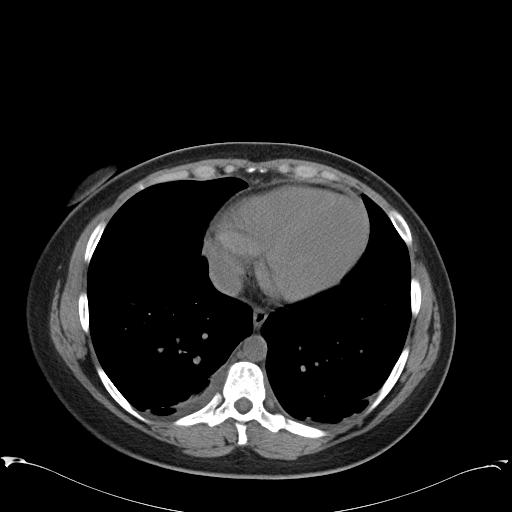

[Series 602: cor · coronal · 0.85mm/px · 3 of 117 slices shown]
[im 39/117  soft-tissue]
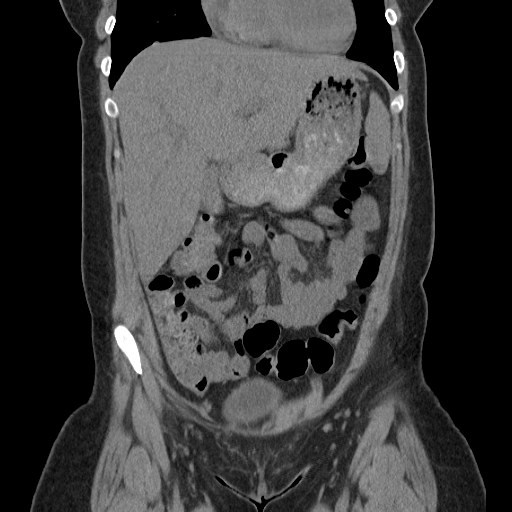
[im 52/117  soft-tissue]
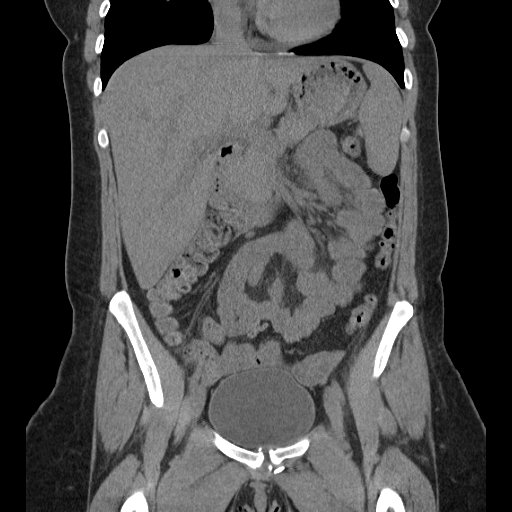
[im 65/117  soft-tissue]
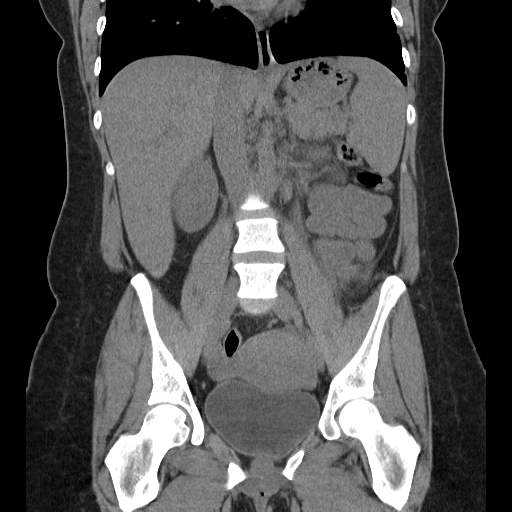

[17 of 46 positions shown; findings below may reference images not displayed]

FINDINGS: Trace bilateral pleural effusions with associated lower
lobe atelectasis.

Unenhanced liver, spleen, pancreas, and adrenal glands within
normal limits.

Gallbladder is unremarkable.  No intrahepatic or extrahepatic
ductal dilatation.

Right kidney is within normal limits.  The left kidney is notable
for at least three nonobstructing calculi measuring up to 6 mm.
Left hydronephrosis.  12 mm calculus within a dilated proximal
right renal collecting system (coronal image 77).  Mild
perinephric/periureteral stranding.

No evidence of bowel obstruction.  Normal appendix.

No evidence of abdominal aortic aneurysm.

Suspicious abdominopelvic lymphadenopathy.

Uterus and bilateral ovaries are unremarkable.

Small volume pelvic ascites, likely physiologic.

No distal ureteral or bladder calculi.

Visualized osseous structures are within normal limits.
IMPRESSION: 12 mm calculus within a dilated proximal left renal collecting
system.  Mild left hydronephrosis with surrounding
perinephric/periureteral stranding.

Three additional nonobstructing left renal calculi measuring up to
6 mm.

## 2013-03-27 IMAGING — US US RENAL
1 series · 14 of 25 positions shown · non-contrast
Comparison: None.

CLINICAL DATA: Pyelonephritis.  Evaluate for abscess.

RENAL/URINARY TRACT ULTRASOUND COMPLETE

[Series 1: us renal · 0.32mm/px · 14 of 37 slices shown]
[im 1/37]
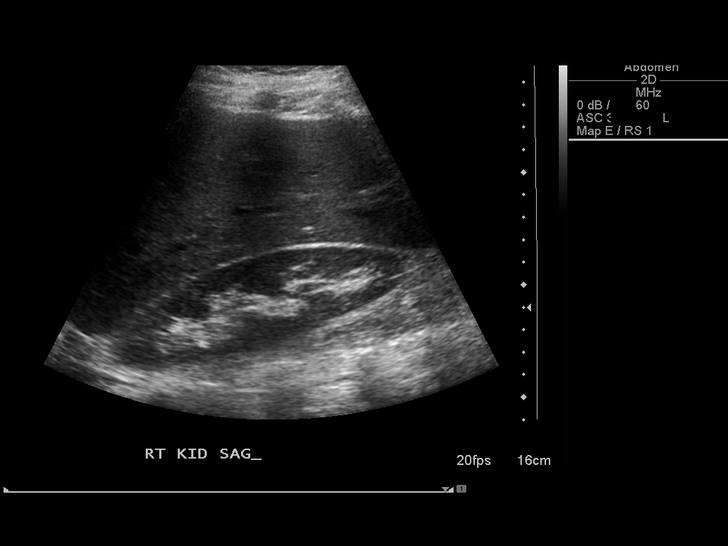
[im 4/37]
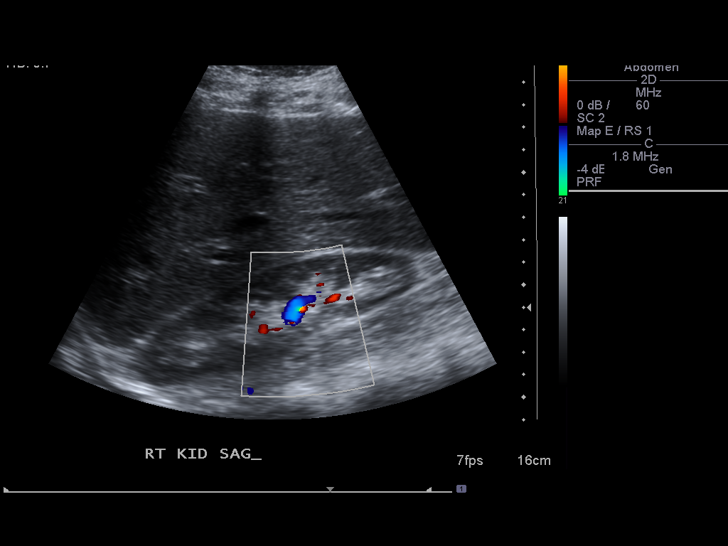
[im 7/37]
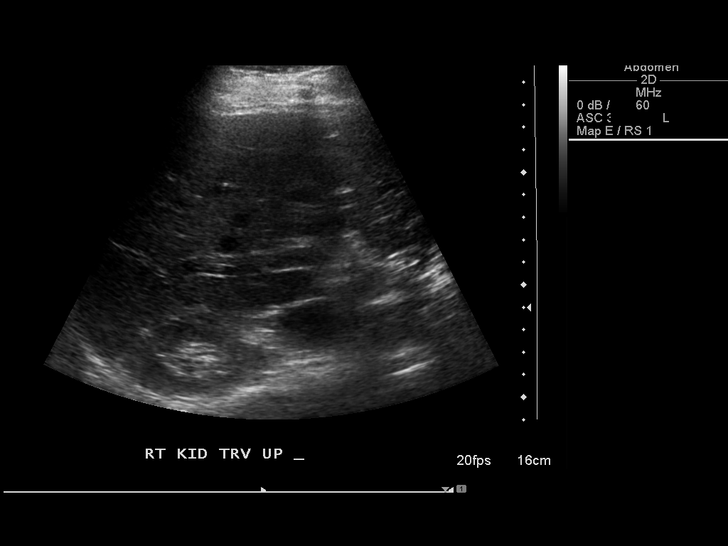
[im 10/37]
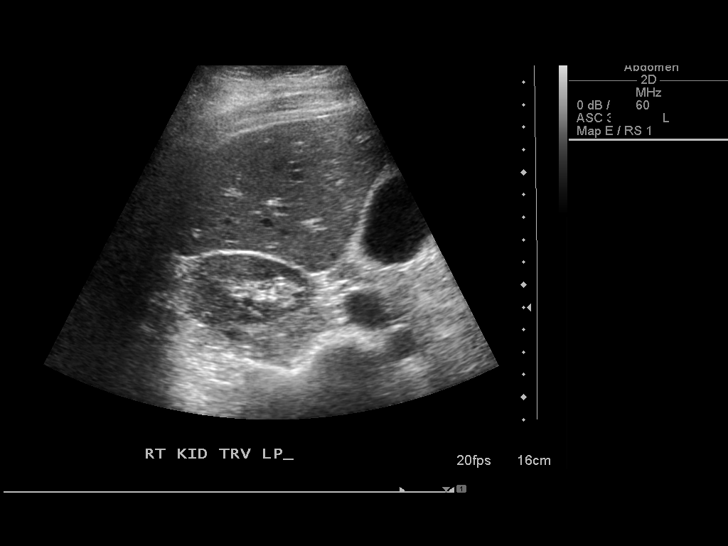
[im 13/37]
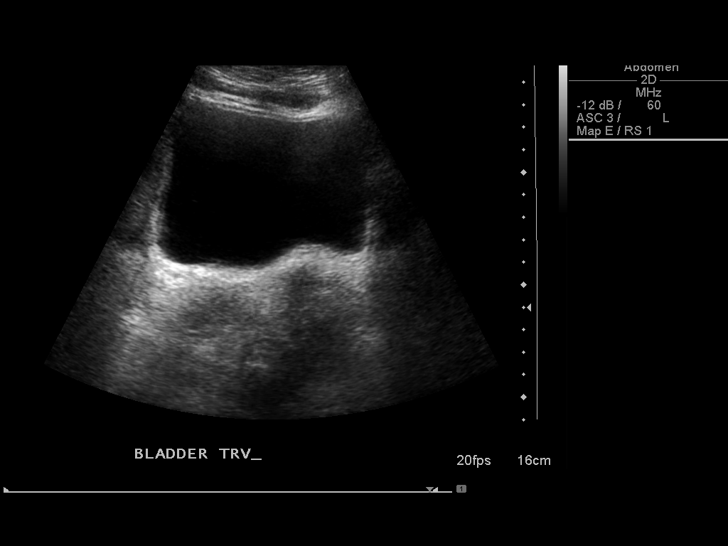
[im 14/37]
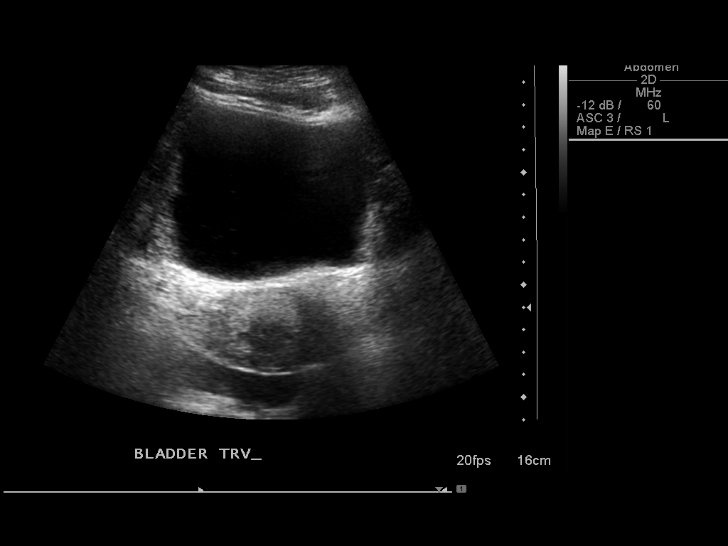
[im 17/37]
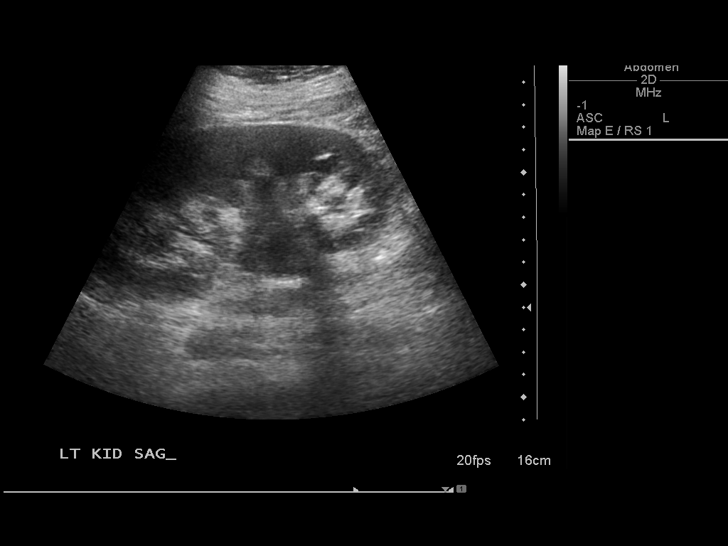
[im 20/37]
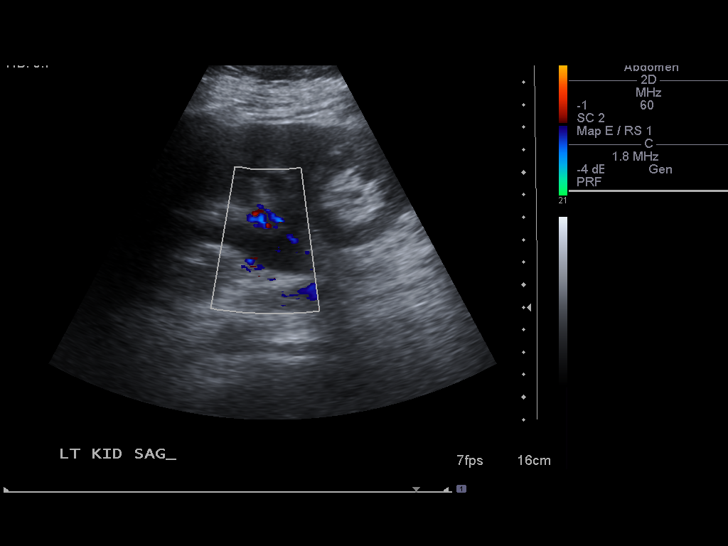
[im 23/37]
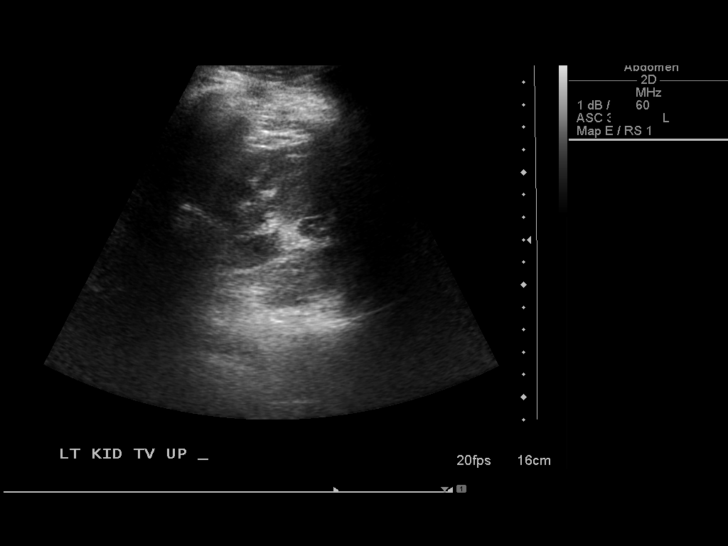
[im 25/37]
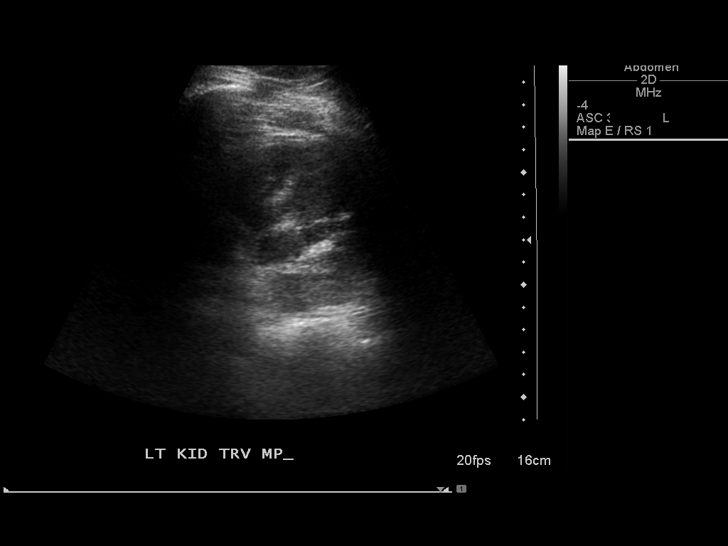
[im 28/37]
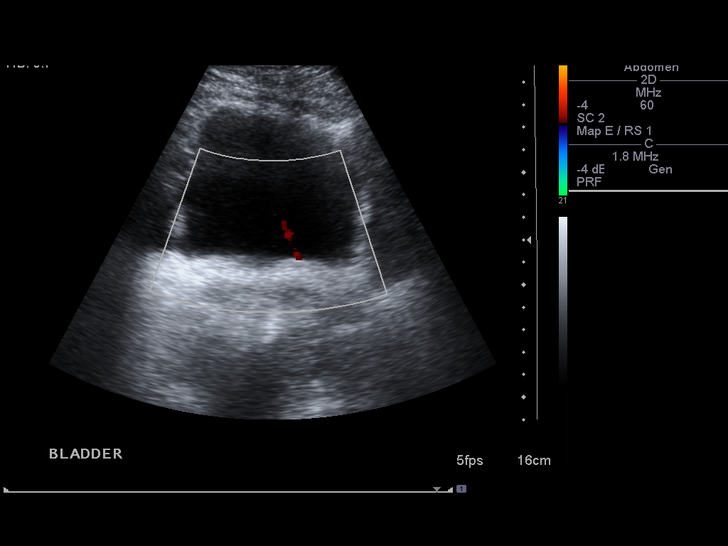
[im 31/37]
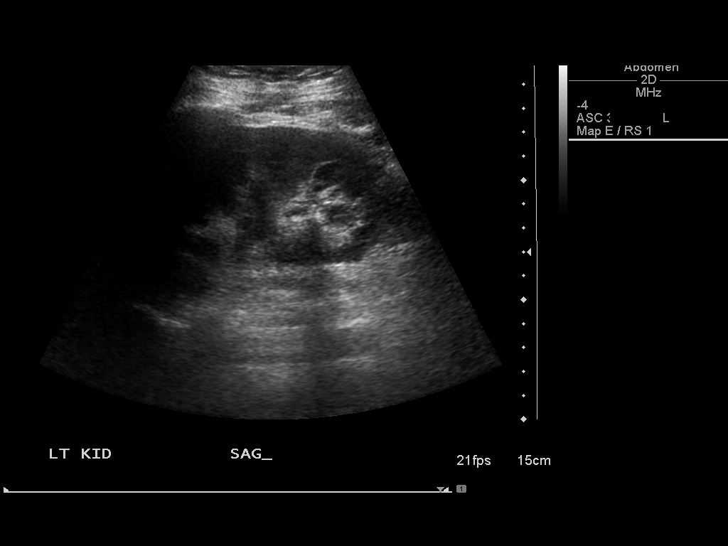
[im 34/37]
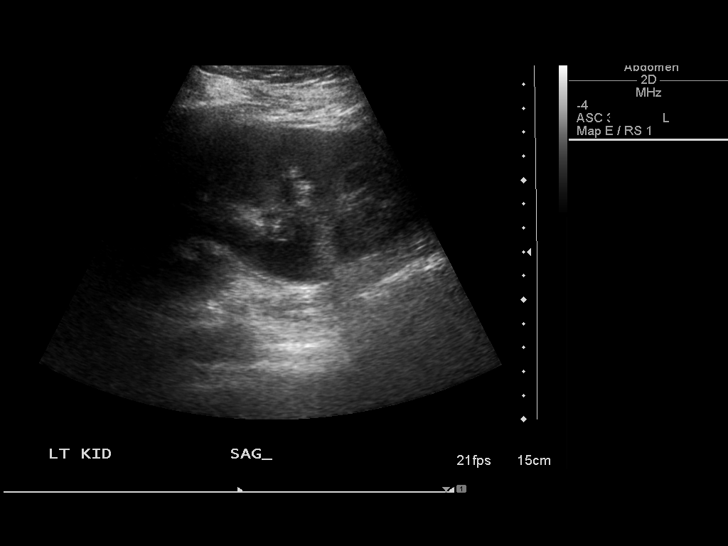
[im 37/37]
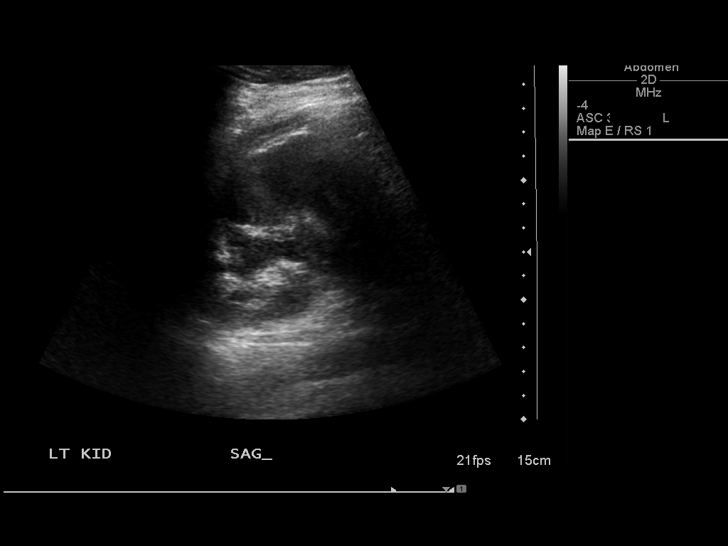

[14 of 25 positions shown; findings below may reference images not displayed]

FINDINGS: Right Kidney:  13.5 cm in length.  Normal renal cortical thickness
and echogenicity.  No hydronephrosis or renal lesion.  No
perinephric fluid collection.

Left Kidney:  13.7 cm in length.  Mild hydronephrosis.  No renal
mass lesion or perinephric fluid collection.

Bladder:  Normal.  Bilateral ureteral jets are noted.
IMPRESSION: Left sided hydronephrosis of uncertain etiology.  No obvious renal
mass or abscess.

## 2013-07-20 ENCOUNTER — Ambulatory Visit: Payer: Medicaid Other | Admitting: Advanced Practice Midwife

## 2013-07-20 ENCOUNTER — Encounter: Payer: Self-pay | Admitting: Advanced Practice Midwife

## 2013-07-20 VITALS — BP 146/94 | HR 85 | Temp 97.5°F | Ht 64.0 in | Wt 253.0 lb

## 2013-09-20 NOTE — Progress Notes (Signed)
No show

## 2013-11-07 ENCOUNTER — Encounter (HOSPITAL_BASED_OUTPATIENT_CLINIC_OR_DEPARTMENT_OTHER): Payer: Self-pay | Admitting: Emergency Medicine

## 2013-11-07 ENCOUNTER — Emergency Department (HOSPITAL_BASED_OUTPATIENT_CLINIC_OR_DEPARTMENT_OTHER): Payer: Self-pay

## 2013-11-07 ENCOUNTER — Emergency Department (HOSPITAL_BASED_OUTPATIENT_CLINIC_OR_DEPARTMENT_OTHER)
Admission: EM | Admit: 2013-11-07 | Discharge: 2013-11-07 | Disposition: A | Discharge status: Home / Self Care | Payer: Self-pay | Attending: Emergency Medicine | Admitting: Emergency Medicine

## 2013-11-07 DIAGNOSIS — Y93E5 Activity, floor mopping and cleaning: Secondary | ICD-10-CM | POA: Insufficient documentation

## 2013-11-07 DIAGNOSIS — R296 Repeated falls: Secondary | ICD-10-CM | POA: Insufficient documentation

## 2013-11-07 DIAGNOSIS — F17200 Nicotine dependence, unspecified, uncomplicated: Secondary | ICD-10-CM | POA: Insufficient documentation

## 2013-11-07 DIAGNOSIS — Y92009 Unspecified place in unspecified non-institutional (private) residence as the place of occurrence of the external cause: Secondary | ICD-10-CM | POA: Insufficient documentation

## 2013-11-07 DIAGNOSIS — Z8659 Personal history of other mental and behavioral disorders: Secondary | ICD-10-CM | POA: Insufficient documentation

## 2013-11-07 DIAGNOSIS — S90129A Contusion of unspecified lesser toe(s) without damage to nail, initial encounter: Secondary | ICD-10-CM | POA: Insufficient documentation

## 2013-11-07 DIAGNOSIS — X500XXA Overexertion from strenuous movement or load, initial encounter: Secondary | ICD-10-CM | POA: Insufficient documentation

## 2013-11-07 DIAGNOSIS — Z87442 Personal history of urinary calculi: Secondary | ICD-10-CM | POA: Insufficient documentation

## 2013-11-07 MED ORDER — IBUPROFEN 800 MG PO TABS: 800.0000 mg | ORAL_TABLET | Freq: Three times a day (TID) | ORAL | Status: DC

## 2013-11-07 MED ORDER — HYDROCODONE-ACETAMINOPHEN 5-325 MG PO TABS: 2.0000 | ORAL_TABLET | ORAL | Status: DC | PRN

## 2013-11-07 NOTE — Discharge Instructions (Signed)
Contusion A contusion is a deep bruise. Contusions are the result of an injury that caused bleeding under the skin. The contusion may turn blue, purple, or yellow. Minor injuries will give you a painless contusion, but more severe contusions may stay painful and swollen for a few weeks.  CAUSES  A contusion is usually caused by a blow, trauma, or direct force to an area of the body. SYMPTOMS   Swelling and redness of the injured area.  Bruising of the injured area.  Tenderness and soreness of the injured area.  Pain. DIAGNOSIS  The diagnosis can be made by taking a history and physical exam. An X-ray, CT scan, or MRI may be needed to determine if there were any associated injuries, such as fractures. TREATMENT  Specific treatment will depend on what area of the body was injured. In general, the best treatment for a contusion is resting, icing, elevating, and applying cold compresses to the injured area. Over-the-counter medicines may also be recommended for pain control. Ask your caregiver what the best treatment is for your contusion. HOME CARE INSTRUCTIONS   Put ice on the injured area.  Put ice in a plastic bag.  Place a towel between your skin and the bag.  Leave the ice on for 15-20 minutes, 03-04 times a day.  Only take over-the-counter or prescription medicines for pain, discomfort, or fever as directed by your caregiver. Your caregiver may recommend avoiding anti-inflammatory medicines (aspirin, ibuprofen, and naproxen) for 48 hours because these medicines may increase bruising.  Rest the injured area.  If possible, elevate the injured area to reduce swelling. SEEK IMMEDIATE MEDICAL CARE IF:   You have increased bruising or swelling.  You have pain that is getting worse.  Your swelling or pain is not relieved with medicines. MAKE SURE YOU:   Understand these instructions.  Will watch your condition.  Will get help right away if you are not doing well or get  worse. Document Released: 06/12/2005 Document Revised: 11/25/2011 Document Reviewed: 07/08/2011 [facility] Patient Information 2014 [place], [place].  Toe Injuries and Amputations You have cut off (amputated) part of your toe. Your outcome depends largely on how much was amputated. If just the tip is amputated, often the end of the toe will grow back and the toe may return much to the same as it was before the injury. If more of the toe is missing, your caregiver has done the best with the tissue remaining to allow you to keep as much toe as is possible or has finished the amputation at a level that will leave you with the most functional toe. This means a toe that will work the best for you. Please read the instructions outlined below and refer to this sheet in the next few weeks. These instructions provide you with general information on caring for yourself. Your caregiver may also give you specific instructions. While your treatment has been done according to the most current medical practices available, unavoidable complications occasionally occur. If you have any problems or questions after discharge, call your caregiver. HOME CARE INSTRUCTIONS   You may resume a normal diet and activities as directed or allowed.  Keep your foot elevated when possible. This helps decrease pain and swelling.  Keep ice packs (a bag of ice wrapped in a towel) on the injured area for 15-20 minutes, 03-04 times per day, for the first two days. Use ice only if OK with your caregiver.  Change dressings if necessary or as directed.  Clean the wounded area as directed.  Only take over-the-counter or prescription medicines for pain, discomfort, or fever as directed by your caregiver.  Keep appointments as directed. SEEK IMMEDIATE MEDICAL CARE IF:  There is redness, swelling, numbness or increasing pain in the wound.  There is pus coming from wound.  You have an unexplained oral temperature above 102 F (38.9 C)  or as your caregiver suggests.  There is a bad (foul) smell coming from the wound or dressing.  The edges of the wound break open (the edges are not staying together) after sutures or staples have been removed. Document Released: 07/24/2005 Document Revised: 11/25/2011 Document Reviewed: 12/21/2008 [facility] Patient Information 2014 [place], [place].

## 2013-11-07 NOTE — ED Provider Notes (Signed)
CSN: [id number]     Arrival date & time 11/07/13  1319 History   First MD Initiated Contact with Patient 11/07/13 1333     Chief Complaint  Patient presents with  . Toe Injury     (Consider location/radiation/quality/duration/timing/severity/associated sxs/prior Treatment) HPI Comments: Patient here after falling this morning while mopping her floor, reports landing on her right foot with bruising to her right great toe - has been ambulatory on it with limp - denies numbness or tingling.  Patient is a 33 y.o. female presenting with toe pain. The history is provided by the patient. No language interpreter was used.  Toe Pain This is a new problem. The current episode started today. The problem occurs constantly. The problem has been unchanged. Associated symptoms include arthralgias and joint swelling. Pertinent negatives include no neck pain, numbness or weakness. She has tried nothing for the symptoms. The treatment provided no relief.    Past Medical History  Diagnosis Date  . Bipolar disorder     not on meds  . Nephrolithiasis     left    Past Surgical History  Procedure Laterality Date  . Left nephrostomy  Oct 29, 2010  . Tubal ligation  2008  . Nephrolithotomy  12/06/2011    Procedure: NEPHROLITHOTOMY PERCUTANEOUS;  Surgeon: Antony Haste, MD;  Location: [facility];  Service: Urology;  Laterality: Left;   No family history on file. History  Substance Use Topics  . Smoking status: Current Every Day Smoker -- 1.00 packs/day for 18 years    Types: Cigarettes  . Smokeless tobacco: Never Used  . Alcohol Use: Yes     Comment: very rare use   OB History   Grav Para Term Preterm Abortions TAB SAB Ect Mult Living                 Review of Systems  Musculoskeletal: Positive for arthralgias and joint swelling. Negative for neck pain.  Neurological: Negative for weakness and numbness.  All other systems reviewed and are negative.      Allergies  Ketorolac  tromethamine; Ultram; and Codeine  Home Medications   Current Outpatient Rx  Name  Route  Sig  Dispense  Refill  . HYDROcodone-acetaminophen (NORCO/VICODIN) 5-325 MG per tablet   Oral   Take 2 tablets by mouth every 4 (four) hours as needed.   10 tablet   0   . ibuprofen (ADVIL,MOTRIN) 800 MG tablet   Oral   Take 1 tablet (800 mg total) by mouth 3 (three) times daily.   21 tablet   0    BP 100/68  Pulse 80  Temp(Src) 98 F (36.7 C) (Oral)  Resp 18  SpO2 100%  LMP 11/07/2013 Physical Exam  Nursing note and vitals reviewed. Constitutional: She is oriented to person, place, and time. She appears well-developed and well-nourished. No distress.  HENT:  Head: Normocephalic and atraumatic.  Mouth/Throat: Oropharynx is clear and moist.  Eyes: Conjunctivae are normal. No scleral icterus.  Neck: Normal range of motion.  Pulmonary/Chest: Effort normal.  Musculoskeletal:       Right foot: She exhibits tenderness and bony tenderness. She exhibits no swelling, normal capillary refill and no deformity.       Feet:  Neurological: She is alert and oriented to person, place, and time. She exhibits normal muscle tone. Coordination normal.  Skin: Skin is warm and dry. No rash noted. No erythema. No pallor.  Psychiatric: She has a normal mood and affect. Her behavior is  normal. Judgment and thought content normal.    ED Course  Procedures (including critical care time) Labs Review Labs Reviewed - No data to display Imaging Review Dg Foot Complete Right  11/07/2013   CLINICAL DATA:  Slipped and fell  EXAM: RIGHT FOOT COMPLETE - 3+ VIEW  COMPARISON:  None  FINDINGS: There is no evidence of fracture or dislocation. There is no evidence of arthropathy or other focal bone abnormality. Soft tissues are unremarkable.  IMPRESSION: Negative.   Electronically Signed   By: Signa Kellaylor  Stroud M.D.   On: 11/07/2013 13:56    EKG Interpretation   None       MDM   Final diagnoses:  Contusion of  toe   Patient here with contusion of right great toe, small hematoma noted, no fracture, placed in post op shoe, RICE.    Izola PriceFrances C. Marisue HumbleSanford, PA-C 11/07/13 1428

## 2013-11-07 NOTE — ED Notes (Signed)
Patient fell this morning and her right foot twisted under her. Right great toe and inside of foot swollen, bruised and painful. Also having pain across the top of her foot.

## 2013-11-07 NOTE — ED Provider Notes (Signed)
Medical screening examination/treatment/procedure(s) were performed by non-physician practitioner and as supervising physician I was immediately available for consultation/collaboration.  EKG Interpretation   None        Mccoy Testa F Yaakov Saindon, MD 11/07/13 1920 

## 2014-06-09 ENCOUNTER — Emergency Department (HOSPITAL_COMMUNITY)
Admission: EM | Admit: 2014-06-09 | Discharge: 2014-06-10 | Disposition: A | Discharge status: Home / Self Care | Payer: Self-pay | Attending: Emergency Medicine | Admitting: Emergency Medicine

## 2014-06-09 ENCOUNTER — Encounter (HOSPITAL_COMMUNITY): Payer: Self-pay | Admitting: Emergency Medicine

## 2014-06-09 DIAGNOSIS — S4980XA Other specified injuries of shoulder and upper arm, unspecified arm, initial encounter: Secondary | ICD-10-CM | POA: Insufficient documentation

## 2014-06-09 DIAGNOSIS — IMO0002 Reserved for concepts with insufficient information to code with codable children: Secondary | ICD-10-CM | POA: Insufficient documentation

## 2014-06-09 DIAGNOSIS — Z87442 Personal history of urinary calculi: Secondary | ICD-10-CM | POA: Insufficient documentation

## 2014-06-09 DIAGNOSIS — S298XXA Other specified injuries of thorax, initial encounter: Secondary | ICD-10-CM | POA: Insufficient documentation

## 2014-06-09 DIAGNOSIS — Y9389 Activity, other specified: Secondary | ICD-10-CM | POA: Insufficient documentation

## 2014-06-09 DIAGNOSIS — Y92410 Unspecified street and highway as the place of occurrence of the external cause: Secondary | ICD-10-CM | POA: Insufficient documentation

## 2014-06-09 DIAGNOSIS — S5010XA Contusion of unspecified forearm, initial encounter: Secondary | ICD-10-CM | POA: Insufficient documentation

## 2014-06-09 DIAGNOSIS — Z23 Encounter for immunization: Secondary | ICD-10-CM | POA: Insufficient documentation

## 2014-06-09 DIAGNOSIS — Z8659 Personal history of other mental and behavioral disorders: Secondary | ICD-10-CM | POA: Insufficient documentation

## 2014-06-09 DIAGNOSIS — S40812A Abrasion of left upper arm, initial encounter: Secondary | ICD-10-CM

## 2014-06-09 DIAGNOSIS — F17200 Nicotine dependence, unspecified, uncomplicated: Secondary | ICD-10-CM | POA: Insufficient documentation

## 2014-06-09 DIAGNOSIS — S40022A Contusion of left upper arm, initial encounter: Secondary | ICD-10-CM

## 2014-06-09 DIAGNOSIS — S46912A Strain of unspecified muscle, fascia and tendon at shoulder and upper arm level, left arm, initial encounter: Secondary | ICD-10-CM

## 2014-06-09 DIAGNOSIS — S46909A Unspecified injury of unspecified muscle, fascia and tendon at shoulder and upper arm level, unspecified arm, initial encounter: Secondary | ICD-10-CM | POA: Insufficient documentation
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
## 2014-06-10 ENCOUNTER — Emergency Department (HOSPITAL_COMMUNITY): Payer: Self-pay

## 2014-06-10 MED ORDER — ONDANSETRON HCL 4 MG/2ML IJ SOLN
4.0000 mg | Freq: Once | INTRAMUSCULAR | Status: AC
  Administered 2014-06-10: 4 mg via INTRAVENOUS
  Filled 2014-06-10: qty 2

## 2014-06-10 MED ORDER — HYDROMORPHONE HCL 1 MG/ML IJ SOLN
1.0000 mg | Freq: Once | INTRAMUSCULAR | Status: AC
  Administered 2014-06-10: 1 mg via INTRAVENOUS

## 2014-06-10 MED ORDER — HYDROMORPHONE HCL 1 MG/ML IJ SOLN
1.0000 mg | Freq: Once | INTRAMUSCULAR | Status: DC
  Filled 2014-06-10: qty 1

## 2014-06-10 MED ORDER — TETANUS-DIPHTH-ACELL PERTUSSIS 5-2.5-18.5 LF-MCG/0.5 IM SUSP
0.5000 mL | Freq: Once | INTRAMUSCULAR | Status: AC
  Administered 2014-06-10: 0.5 mL via INTRAMUSCULAR
  Filled 2014-06-10: qty 0.5

## 2014-06-10 MED ORDER — ONDANSETRON 4 MG PO TBDP
4.0000 mg | ORAL_TABLET | Freq: Once | ORAL | Status: DC
  Filled 2014-06-10: qty 1

## 2014-06-10 MED ORDER — OXYCODONE-ACETAMINOPHEN 5-325 MG PO TABS: 1.0000 | ORAL_TABLET | Freq: Four times a day (QID) | ORAL | Status: DC | PRN
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
## 2014-06-10 NOTE — Discharge Instructions (Signed)

## 2015-06-13 ENCOUNTER — Emergency Department (HOSPITAL_BASED_OUTPATIENT_CLINIC_OR_DEPARTMENT_OTHER): Payer: Self-pay

## 2015-06-13 ENCOUNTER — Emergency Department (HOSPITAL_BASED_OUTPATIENT_CLINIC_OR_DEPARTMENT_OTHER)
Admission: EM | Admit: 2015-06-13 | Discharge: 2015-06-13 | Disposition: A | Discharge status: Home / Self Care | Payer: Self-pay | Attending: Emergency Medicine | Admitting: Emergency Medicine

## 2015-06-13 DIAGNOSIS — Z3202 Encounter for pregnancy test, result negative: Secondary | ICD-10-CM | POA: Insufficient documentation

## 2015-06-13 DIAGNOSIS — R102 Pelvic and perineal pain: Secondary | ICD-10-CM

## 2015-06-13 DIAGNOSIS — N730 Acute parametritis and pelvic cellulitis: Secondary | ICD-10-CM

## 2015-06-13 DIAGNOSIS — A599 Trichomoniasis, unspecified: Secondary | ICD-10-CM

## 2015-06-13 DIAGNOSIS — Z9851 Tubal ligation status: Secondary | ICD-10-CM | POA: Insufficient documentation

## 2015-06-13 DIAGNOSIS — A5901 Trichomonal vulvovaginitis: Secondary | ICD-10-CM | POA: Insufficient documentation

## 2015-06-13 DIAGNOSIS — Z720 Tobacco use: Secondary | ICD-10-CM | POA: Insufficient documentation

## 2015-06-13 DIAGNOSIS — Z87442 Personal history of urinary calculi: Secondary | ICD-10-CM | POA: Insufficient documentation

## 2015-06-13 LAB — CBC WITH DIFFERENTIAL/PLATELET
BASOS ABS: 0 10*3/uL (ref 0.0–0.1)
Basophils Relative: 0 %
Eosinophils Absolute: 0.6 10*3/uL (ref 0.0–0.7)
Eosinophils Relative: 7 %
HEMATOCRIT: 39.9 % (ref 36.0–46.0)
HEMOGLOBIN: 13.2 g/dL (ref 12.0–15.0)
Lymphocytes Relative: 29 %
Lymphs Abs: 2.6 10*3/uL (ref 0.7–4.0)
MCH: 31.4 pg (ref 26.0–34.0)
MCHC: 33.1 g/dL (ref 30.0–36.0)
MCV: 94.8 fL (ref 78.0–100.0)
MONO ABS: 0.6 10*3/uL (ref 0.1–1.0)
Monocytes Relative: 7 %
NEUTROS ABS: 5.2 10*3/uL (ref 1.7–7.7)
NEUTROS PCT: 57 %
Platelets: 271 10*3/uL (ref 150–400)
RBC: 4.21 MIL/uL (ref 3.87–5.11)
RDW: 13.3 % (ref 11.5–15.5)
WBC: 9.1 10*3/uL (ref 4.0–10.5)

## 2015-06-13 LAB — URINALYSIS, ROUTINE W REFLEX MICROSCOPIC
Bilirubin Urine: NEGATIVE
Glucose, UA: NEGATIVE mg/dL
Hgb urine dipstick: NEGATIVE
KETONES UR: NEGATIVE mg/dL
NITRITE: NEGATIVE
Protein, ur: NEGATIVE mg/dL
Specific Gravity, Urine: 1.005 (ref 1.005–1.030)
UROBILINOGEN UA: 0.2 mg/dL (ref 0.0–1.0)
pH: 6.5 (ref 5.0–8.0)

## 2015-06-13 LAB — BASIC METABOLIC PANEL
Anion gap: 4 — ABNORMAL LOW (ref 5–15)
BUN: 7 mg/dL (ref 6–20)
CHLORIDE: 111 mmol/L (ref 101–111)
CO2: 24 mmol/L (ref 22–32)
CREATININE: 0.66 mg/dL (ref 0.44–1.00)
Calcium: 8.3 mg/dL — ABNORMAL LOW (ref 8.9–10.3)
GFR calc Af Amer: >60 mL/min (ref >60)
GFR calc non Af Amer: >60 mL/min (ref >60)
Glucose, Bld: 99 mg/dL (ref 65–99)
Potassium: 4.2 mmol/L (ref 3.5–5.1)
Sodium: 139 mmol/L (ref 135–145)

## 2015-06-13 LAB — PREGNANCY, URINE: PREG TEST UR: NEGATIVE

## 2015-06-13 LAB — URINE MICROSCOPIC-ADD ON

## 2015-06-13 LAB — WET PREP, GENITAL: Yeast Wet Prep HPF POC: NONE SEEN

## 2015-06-13 MED ORDER — SODIUM CHLORIDE 0.9 % IV BOLUS (SEPSIS)
1000.0000 mL | Freq: Once | INTRAVENOUS | Status: AC
  Administered 2015-06-13: 1000 mL via INTRAVENOUS

## 2015-06-13 MED ORDER — CEFTRIAXONE SODIUM 250 MG IJ SOLR
250.0000 mg | Freq: Once | INTRAMUSCULAR | Status: AC
  Administered 2015-06-13: 250 mg via INTRAMUSCULAR
  Filled 2015-06-13: qty 250

## 2015-06-13 MED ORDER — DOXYCYCLINE HYCLATE 100 MG PO CAPS: 100.0000 mg | ORAL_CAPSULE | Freq: Two times a day (BID) | ORAL | Status: DC

## 2015-06-13 MED ORDER — HYDROCODONE-ACETAMINOPHEN 5-325 MG PO TABS
2.0000 | ORAL_TABLET | Freq: Once | ORAL | Status: AC
Start: 2015-06-13 — End: 2015-06-13
  Administered 2015-06-13: 2 via ORAL
  Filled 2015-06-13: qty 2

## 2015-06-13 MED ORDER — ONDANSETRON HCL 4 MG/2ML IJ SOLN
4.0000 mg | Freq: Once | INTRAMUSCULAR | Status: AC
  Administered 2015-06-13: 4 mg via INTRAVENOUS
  Filled 2015-06-13: qty 2

## 2015-06-13 MED ORDER — HYDROCODONE-ACETAMINOPHEN 5-325 MG PO TABS: 1.0000 | ORAL_TABLET | ORAL | Status: DC | PRN

## 2015-06-13 MED ORDER — METRONIDAZOLE 500 MG PO TABS
2000.0000 mg | ORAL_TABLET | Freq: Once | ORAL | Status: AC
  Administered 2015-06-13: 2000 mg via ORAL
  Filled 2015-06-13: qty 4

## 2015-06-13 MED ORDER — DOXYCYCLINE HYCLATE 100 MG PO TABS
100.0000 mg | ORAL_TABLET | Freq: Once | ORAL | Status: AC
  Administered 2015-06-13: 100 mg via ORAL
  Filled 2015-06-13: qty 1

## 2015-06-13 MED ORDER — HYDROMORPHONE HCL 1 MG/ML IJ SOLN
1.0000 mg | Freq: Once | INTRAMUSCULAR | Status: AC
  Administered 2015-06-13: 1 mg via INTRAVENOUS
  Filled 2015-06-13: qty 1

## 2015-06-13 NOTE — Discharge Instructions (Signed)
Pelvic Inflammatory Disease °Pelvic inflammatory disease (PID) refers to an infection in some or all of the female organs. The infection can be in the uterus, ovaries, fallopian tubes, or the surrounding tissues in the pelvis. PID can cause abdominal or pelvic pain that comes on suddenly (acute pelvic pain). PID is a serious infection because it can lead to lasting (chronic) pelvic pain or the inability to have children (infertile).  °CAUSES  °The infection is often caused by the normal bacteria found in the vaginal tissues. PID may also be caused by an infection that is spread during sexual contact. PID can also occur following:  °· The birth of a baby.   °· A miscarriage.   °· An abortion.   °· Major pelvic surgery.   °· The use of an intrauterine device (IUD).   °· A sexual assault.   °RISK FACTORS °Certain factors can put a person at higher risk for PID, such as: °· Being younger than 25 years. °· Being sexually active at a young age. °· Using nonbarrier contraception. °· Having multiple sexual partners. °· Having sex with someone who has symptoms of a genital infection. °· Using oral contraception. °Other times, certain behaviors can increase the possibility of getting PID, such as: °· Having sex during your period. °· Using a vaginal douche. °· Having an intrauterine device (IUD) in place. °SYMPTOMS  °· Abdominal or pelvic pain.   °· Fever.   °· Chills.   °· Abnormal vaginal discharge. °· Abnormal uterine bleeding.   °· Unusual pain shortly after finishing your period. °DIAGNOSIS  °Your caregiver will choose some of the following methods to make a diagnosis, such as:  °· Performing a physical exam and history. A pelvic exam typically reveals a very tender uterus and surrounding pelvis.   °· Ordering laboratory tests including a pregnancy test, blood tests, and urine test.  °· Ordering cultures of the vagina and cervix to check for a sexually transmitted infection (STI). °· Performing an ultrasound.    °· Performing a laparoscopic procedure to look inside the pelvis.   °TREATMENT  °· Antibiotic medicines may be prescribed and taken by mouth.   °· Sexual partners may be treated when the infection is caused by a sexually transmitted disease (STD).   °· Hospitalization may be needed to give antibiotics intravenously. °· Surgery may be needed, but this is rare. °It may take weeks until you are completely well. If you are diagnosed with PID, you should also be checked for human immunodeficiency virus (HIV).   °HOME CARE INSTRUCTIONS  °· If given, take your antibiotics as directed. Finish the medicine even if you start to feel better.   °· Only take over-the-counter or prescription medicines for pain, discomfort, or fever as directed by your caregiver.   °· Do not have sexual intercourse until treatment is completed or as directed by your caregiver. If PID is confirmed, your recent sexual partner(s) will need treatment.   °· Keep your follow-up appointments. °SEEK MEDICAL CARE IF:  °· You have increased or abnormal vaginal discharge.   °· You need prescription medicine for your pain.   °· You vomit.   °· You cannot take your medicines.   °· Your partner has an STD.   °SEEK IMMEDIATE MEDICAL CARE IF:  °· You have a fever.   °· You have increased abdominal or pelvic pain.   °· You have chills.   °· You have pain when you urinate.   °· You are not better after 72 hours following treatment.   °MAKE SURE YOU:  °· Understand these instructions. °· Will watch your condition. °· Will get help right away if you are not doing well or get worse. °  Document Released: 09/02/2005 Document Revised: 12/28/2012 Document Reviewed: 08/29/2011 °ExitCare® Patient Information ©2015 [employer]. This information is not intended to replace advice given to you by your health care provider. Make sure you discuss any questions you have with your health care provider. ° ° °Emergency Department Resource Guide °1) Find a Doctor and Pay Out of  Pocket °Although you won't have to find out who is covered by your insurance plan, it is a good idea to ask around and get recommendations. You will then need to call the office and see if the doctor you have chosen will accept you as a new patient and what types of options they offer for patients who are self-pay. Some doctors offer discounts or will set up payment plans for their patients who do not have insurance, but you will need to ask so you aren't surprised when you get to your appointment. ° °2) Contact Your Local Health Department °Not all health departments have doctors that can see patients for sick visits, but many do, so it is worth a call to see if yours does. If you don't know where your local health department is, you can check in your phone book. The CDC also has a tool to help you locate your state's health department, and many state websites also have listings of all of their local health departments. ° °3) Find a Walk-in Clinic °If your illness is not likely to be very severe or complicated, you may want to try a walk in clinic. These are popping up all over the country in pharmacies, drugstores, and shopping centers. They're usually staffed by nurse practitioners or physician assistants that have been trained to treat common illnesses and complaints. They're usually fairly quick and inexpensive. However, if you have serious medical issues or chronic medical problems, these are probably not your best option. ° °No Primary Care Doctor: °- Call [facility] at  832-8000 - they can help you locate a primary care doctor that  accepts your insurance, provides certain services, etc. °- Physician Referral Service- 1-800-533-3463 ° °Chronic Pain Problems: °Organization         Address  Phone   Notes  °[facility]  (336) 297-2271 Patients need to be referred by their primary care doctor.  ° °Medication Assistance: °Organization         Address  Phone   Notes  °[place]  [facility] [address] °[place], [place] [address] (336) 641-8030 --Must be a resident of [place] °-- Must have NO insurance coverage whatsoever (no Medicaid/ Medicare, etc.) °-- The pt. MUST have a primary care doctor that directs their care regularly and follows them in the community °  °[facility]  (866) 331-1348   °[employer]  (888) 892-1162   ° °Agencies that provide inexpensive medical care: °Organization         Address  Phone   Notes  °[facility]  (336) 832-8035   °[facility]    (336) 832-7272   °[facility] [address] °[place], [place] [address] (336) 832-4777   °[facility] [address], °[place] (336) 271-4999   °[facility]    (336) 373-0678   °[facility]    (336) 272-1050   °[facility] ° [address], [place] Phone:  (336) 832-4444, Fax:  (336) 832-4440 Hours of Operation:  9 am - 6 pm, M-F.    Also accepts Medicaid/Medicare and self-pay.  °[facility] ° [address], [place] Phone: (336) 832-3150, Fax: (336) 832-3151. Hours of Operation:  8:30 am - 5:30 pm, M-F.  Also accepts Medicaid and self-pay.  °[facility] [place] [address], [place] Phone: (336) 878-6027   °[facility] [address], [place], [place] (336)723-1848, Ext. 123 Mondays & Thursdays: 7-9 AM.  First 15 patients are seen on a first come, first serve basis. °  ° °Medicaid-accepting [place] Providers: ° °Organization         Address  Phone   Notes  °[facility] [address], [place] (336) 641-2100 Also accepts self-pay patients.  °[facility] [address], [place] ° (336) 856-9996   °[facility] [address], [place] (336) 288-8857   °[facility] [address] [place]  [address], [place] (336) 299-7000   °Veita Bland [address], [place]  ° (336) 373-1557 Only accepts [place] Access Medicaid patients after they have their name applied to their card.  ° °Self-Pay (no insurance) in [place]: ° °Organization         Address  Phone   Notes  °Sickle Cell Patients, [facility] [address], [place] (336) 832-1970   °[facility] [address], [place] (336) 832-4400   °[facility] ° [address], [place] (336) 992-4800   °[facility]/Dr. Osei-Bonsu ° [address] [place] [address], [place] or [address], [place] (336) [phone] Phone number for both [place] and [place] locations is the same.  °[facility] [address], [place] (336) 299-0000   °[facility] [address] [place] [address], [place] or [address] (336) 852-7530 °(336) 878-2260   °[facility] [address], [place] (336) 350-1642, phone; (336) 294-5005, fax Sees patients 1st and 3rd Saturday of every month.  Must not qualify for public or private insurance (i.e. Medicaid, Medicare, [place] Health Choice, Veterans' Benefits) • Household income should be no more than 200% of the poverty level •The clinic cannot treat you if you are pregnant or think you are pregnant • Sexually transmitted diseases are not treated at the clinic.  ° ° °Dental Care: °Organization         Address  Phone  Notes  °[place] [facility] [address], [place] (336) 641-6152 Accepts children up to age 21 who are enrolled in Medicaid or [place] Health Choice; pregnant women with a Medicaid card; and children who have applied for Medicaid or [place] Health Choice, but were declined, whose parents can pay a reduced fee at time of service.  °[facility] [place]  [address], [place]  (336) 641-7733 Accepts children up to age 21 who are enrolled in Medicaid or [place] Health Choice; pregnant women with a Medicaid card; and children who have applied for Medicaid or  Health Choice, but were declined, whose parents can pay a reduced fee at time of service.  °[facility] ° [address],  (336) 641-4533 Patients are seen by appointment only. Walk-ins are not accepted. [facility] will see patients 18 years of age and older. °Monday - Tuesday (  8am-5pm) °Most Wednesdays (8:30-5pm) °$30 per visit, cash only  °[facility] ° [address], [place] (336) 641-4533 Patients are seen by appointment only. Walk-ins are not accepted. [facility] will see patients 18 years of age and older. °One Wednesday Evening (Monthly: Volunteer Based).  $30 per visit, cash only  °[facility]  (919) 537-3737 for adults; Children under age 4, call Graduate Pediatric Dentistry at ([phone]) [phone]. Children aged 4-14, please call (919) [phone] to request a pediatric application. ° Dental services are provided in all areas of dental care including fillings, crowns and bridges, complete and partial dentures, implants, gum treatment, root canals, and extractions. Preventive care is also provided. Treatment is provided to both adults and children. °Patients are selected via a lottery and there is often a waiting list. °  °[facility] [address], °[place] ° (336) 763-8833 [email or web address] °  °[facility] [address], [place], [place] (336)723-1848, Ext. 123 Second and Fourth Thursday of each month, opens at 6:30 AM; Clinic ends at 9 AM.  Patients are seen on a first-come first-served basis, and a limited number are seen during each clinic.  ° °[facility] ° [address], [place], [place] (336) 723-7904   Eligibility Requirements °You must have lived in [place], [place], or [place]  counties for at least the last three months. °  You cannot be eligible for state or federal sponsored healthcare insurance, including Veterans Administration, Medicaid, or Medicare. °  You generally cannot be eligible for healthcare insurance through your employer.  °  How to apply: °Eligibility screenings are held every Tuesday and Wednesday afternoon from 1:00 pm until 4:00 pm. You do not need an appointment for the interview!  °[facility] [address], [place], [place] 336-631-2330   °[facility]  336-342-8273   °[facility]  336-703-3100   °[facility]  336-570-6415   ° °Behavioral Health Resources in the Community: °Intensive Outpatient Programs °Organization         Address  Phone  Notes  °[facility] [address], [place], [place] 336-878-6098   °[facility] [address], [place], [place] 336-832-9800   °[facility] [address], [place], [place] ° 336-882-2125   °[facility] [address],  °[place], [place] 1-800-853-5163 or 336-641-4981   °Substance Abuse Resources °Organization         Address  Phone  Notes  °[facility]  336-882-2125   °[facility]  336-784-9470   °[facility]  336-285-9073   °[facility]  336-845-3988   °Residential & Outpatient Substance Abuse Program  1-800-659-3381   °Psychological Services °Organization         Address  Phone  Notes  °[facility]  336- 832-9600   °[facility]  336- 378-7881   °[facility] [address], [place] 1-800-853-5163 or 336-641-4981   ° °Mobile Crisis Teams °Organization         Address  Phone  Notes  °[facility], Mobile Crisis Care Unit  1-877-626-1772   °Assertive °[facility] ° [address] [place], [place] 336-834-9664   °Sharon DeEsch [address] °[place]  [place] 336-554-5454   ° °Self-Help/Support Groups °Organization         Address  Phone               Notes  °[facility] - variety of support groups  336- 373-1402 Call for more information  °Narcotics Anonymous (NA), [facility] [address], °[place]  2 meetings at this location  ° °Residential Treatment Programs °Organization         Address  Phone  Notes  °[facility] [address],    °[place]  1-866-801-8205   °[facility] ° [address], [place], [place] 704-293-8524   °[facility] [address], [place] 336-845-3988 Admissions: 8am-3pm M-F  °[facility] [address]    °[place], [place] 336-841-1104   °[facility] [address], [place], [place] 336-379-7146   °[facility] [address]  °[place], [place] 336-285-9073   °[facility] - Intensive Outpatient [address], [place], [place] 336-852-3033   °[facility] [address]  °[place], [place] 1-877-615-2722 or 336-784-9470   °[facility] [address] [place], [place] 336-227-7417 Accepts Medicaid  °[facility] [address]  °[place] 1-800-659-3381 Substance Abuse/Addiction Treatment  ° °[place] Behavioral Health Resources °Organization         Address  Phone  Notes  °[facility]  (888) 581-9988   °Julie Brannon, PhD [address] [place], [place]   (336) 349-5553 or (336) 951-0000   °[facility]   [address] °[place], [place] (336) 349-4454   °[facility] [address], [place], [place] (336) 342-8316 Insurance/Medicaid/sponsorship through [facility]  °[facility] [address]                                    [place], [place] (336) 342-8316 Therapy/tele-psych/case  °[facility] [address]  ° [place], [place] (336) 349-2233    °Dr. Arfeen  (336)  349-4544   °[facility] [place]  [employer] [place] [facility] 1) [address], [place] °2) [address], [place] °3)  [address], [place] (336) 349-3220 °(336) 342-7768 ° °(336) 342-8140   °[place] [facility] (336) 342-1394 or (336) 342-3537 (After Hours)    ° ° ° ° °

## 2015-06-13 NOTE — ED Notes (Signed)
Pt back from CT

## 2015-06-13 NOTE — ED Provider Notes (Signed)
CSN: [id number]     Arrival date & time 06/13/15  7829 History   First MD Initiated Contact with Patient 06/13/15 [phone]     Chief Complaint  Patient presents with  . Flank Pain     (Consider location/radiation/quality/duration/timing/severity/associated sxs/prior Treatment) HPI Patient states fairly severe left flank pain that radiates to her mid and lower left abdomen. This has been going on for several days now. She reports that she has had a kidney stone in the past and this feels similar. She also reports that she has a stent and history of a nephrostomy tube. She reports she has had vomiting. 2 episodes yesterday and 2 episodes today. She also reports some loose stool for the past several days. No fever. She reports she has a monogamous relationship and doubt STD. She reports both she and her partner were tested a year ago and tested negative. Past Medical History  Diagnosis Date  . Bipolar disorder     not on meds  . Nephrolithiasis     left    Past Surgical History  Procedure Laterality Date  . Left nephrostomy  Oct 29, 2010  . Tubal ligation  2008  . Nephrolithotomy  12/06/2011    Procedure: NEPHROLITHOTOMY PERCUTANEOUS;  Surgeon: Antony Haste, MD;  Location: [facility];  Service: Urology;  Laterality: Left;   No family history on file. Social History  Substance Use Topics  . Smoking status: Current Every Day Smoker -- 1.00 packs/day for 18 years    Types: Cigarettes  . Smokeless tobacco: Never Used  . Alcohol Use: Yes     Comment: very rare use   OB History    No data available     Review of Systems 10 Systems reviewed and are negative for acute change except as noted in the HPI.    Allergies  Ketorolac tromethamine; Ultram; and Codeine  Home Medications   Prior to Admission medications   Medication Sig Start Date End Date Taking? Authorizing Provider  doxycycline (VIBRAMYCIN) 100 MG capsule Take 1 capsule (100 mg total) by mouth 2 (two) times daily.  One po bid x 7 days 06/13/15   Arby Barrette, MD  HYDROcodone-acetaminophen (NORCO/VICODIN) 5-325 MG tablet Take 1 tablet by mouth every 4 (four) hours as needed for moderate pain or severe pain. 06/13/15   Arby Barrette, MD  oxyCODONE-acetaminophen (PERCOCET) 5-325 MG per tablet Take 1 tablet by mouth every 6 (six) hours as needed. 06/10/14   Purvis Sheffield, MD   BP 120/65 mmHg  Pulse 65  Temp(Src) 97.8 F (36.6 C) (Oral)  Resp 18  Ht  (1.549 m)  Wt 155 lb (70.308 kg)  BMI 29.30 kg/m2  SpO2 100%  LMP 05/16/2015 Physical Exam  Constitutional: She is oriented to person, place, and time. She appears well-developed and well-nourished.  HENT:  Head: Normocephalic and atraumatic.  Eyes: EOM are normal. Pupils are equal, round, and reactive to light.  Neck: Neck supple.  Cardiovascular: Normal rate, regular rhythm, normal heart sounds and intact distal pulses.   Pulmonary/Chest: Effort normal and breath sounds normal.  Abdominal: Soft. Bowel sounds are normal. She exhibits no distension. There is tenderness.  Moderate left lateral abdominal pain and left lower pain palpation. No guarding or rebound. Patient endorses left CVA tenderness. She also endorses to palpation the left lower back over the SI region.  Genitourinary:  Normal external female genitalia. Speculum examination moderate amount of white frothy discharge in the vaginal vault. No bleeding or clot. Bimanual  examination positive for cervical motion tenderness and diffuse pelvic tenderness to palpation. No appreciable mass.  Musculoskeletal: Normal range of motion. She exhibits no edema.  Neurological: She is alert and oriented to person, place, and time. She has normal strength. Coordination normal. GCS eye subscore is 4. GCS verbal subscore is 5. GCS motor subscore is 6.  Skin: Skin is warm, dry and intact.  Psychiatric: She has a normal mood and affect.    ED Course  Procedures (including critical care time) Labs  Review Labs Reviewed  WET PREP, GENITAL - Abnormal; Notable for the following:    Trich, Wet Prep MODERATE (*)    Clue Cells Wet Prep HPF POC TOO NUMEROUS TO COUNT (*)    WBC, Wet Prep HPF POC MANY (*)    All other components within normal limits  URINALYSIS, ROUTINE W REFLEX MICROSCOPIC (NOT AT [facility]) - Abnormal; Notable for the following:    APPearance CLOUDY (*)    Leukocytes, UA MODERATE (*)    All other components within normal limits  URINE MICROSCOPIC-ADD ON - Abnormal; Notable for the following:    Squamous Epithelial / LPF MANY (*)    Bacteria, UA MANY (*)    All other components within normal limits  BASIC METABOLIC PANEL - Abnormal; Notable for the following:    Calcium 8.3 (*)    Anion gap 4 (*)    All other components within normal limits  URINE CULTURE  PREGNANCY, URINE  CBC WITH DIFFERENTIAL/PLATELET  HIV ANTIBODY (ROUTINE TESTING)  GC/CHLAMYDIA PROBE AMP ([place]) NOT AT [facility]    Imaging Review Ct Renal Stone Study  06/13/2015   CLINICAL DATA:  34 year old female with left flank pain and hematuria for 1.5 weeks. Personal history of urologic calculi and previous left nephrostomy. Initial encounter.  EXAM: CT ABDOMEN AND PELVIS WITHOUT CONTRAST  TECHNIQUE: Multidetector CT imaging of the abdomen and pelvis was performed following the standard protocol without IV contrast.  COMPARISON:  [facility] CT Abdomen and Pelvis 12/14/2012. [facility] CT Abdomen and Pelvis 10/29/2011.  FINDINGS: Negative lung bases.  No pericardial or pleural effusion.  No acute osseous abnormality identified. Chronic L5-S1 disc degeneration. Chronic probable posttraumatic changes to the left pubic symphysis. Associated healed fracture of the right inferior pubic ramus  Unchanged small hyperdense area (8 mm) at the left perineum near the urethra and the vaginal orifice probably is a hyperdense retention cyst and of doubtful significance. Small volume pelvic free fluid in  the cul-de-sac (series 2, image 69). Negative noncontrast uterus and adnexa. Decompressed rectum.  Negative sigmoid colon and descending colon, largely decompressed. Negative transverse colon, right colon, and retrocecal appendix. No dilated small bowel. Negative terminal ileum. Decompressed stomach and duodenum.  Negative noncontrast liver, gallbladder, spleen, pancreas and adrenal glands. No abdominal free fluid or free air. No lymphadenopathy identified in the absence of IV contrast.  No perinephric stranding. No nephrolithiasis identified today. The left renal collecting system is decompressed compared to the 2013 study, and stable since 2014. No hydroureter or periureteral stranding identified. No calculus identified along the course of the left ureter. Unremarkable urinary bladder.  IMPRESSION: 1. No urologic calculus identified today.  No obstructive uropathy. 2. Physiologic appearing pelvic free fluid. Normal appendix. No acute or inflammatory process identified.   Electronically Signed   By: Odessa Fleming M.D.   On: 06/13/2015 11:11   I have personally reviewed and evaluated these images and lab results as part of my medical decision-making.  EKG Interpretation None      MDM   Final diagnoses:  Pelvic pain in female  PID (acute pelvic inflammatory disease)   Patient presents with pelvic pain. Exam did identify Trichomonas in the wet specimen. There was cervical motion tenderness and diffuse tenderness on examination. CT did not identify other acute pathology. At this time findings are most consistent with PID secondary to sexually transmitted disease. Patient is treated in the emergency department and instructions are provided. She is made aware of the necessity of evaluation and treatment of her partner. Gonorrhea and chlamydia will be pending.    Arby Barrette, MD 06/14/15 [phone]

## 2015-06-13 NOTE — ED Notes (Signed)
Left flank pain

## 2015-06-13 NOTE — ED Notes (Signed)
Patient transported to CT via bed.

## 2015-06-13 NOTE — ED Notes (Signed)
Patient chose to not stay for the Rocephin hold. Patient reports that she has had this before and she need to go

## 2015-06-14 LAB — HIV ANTIBODY (ROUTINE TESTING W REFLEX): HIV Screen 4th Generation wRfx: NONREACTIVE

## 2015-06-14 LAB — GC/CHLAMYDIA PROBE AMP (~~LOC~~) NOT AT ARMC
CHLAMYDIA, DNA PROBE: NEGATIVE
NEISSERIA GONORRHEA: NEGATIVE

## 2015-06-14 LAB — URINE CULTURE

## 2015-08-22 ENCOUNTER — Emergency Department (HOSPITAL_BASED_OUTPATIENT_CLINIC_OR_DEPARTMENT_OTHER)
Admission: EM | Admit: 2015-08-22 | Discharge: 2015-08-22 | Disposition: A | Discharge status: Home / Self Care | Payer: Self-pay | Attending: Emergency Medicine | Admitting: Emergency Medicine

## 2015-08-22 ENCOUNTER — Encounter (HOSPITAL_BASED_OUTPATIENT_CLINIC_OR_DEPARTMENT_OTHER): Payer: Self-pay | Admitting: Emergency Medicine

## 2015-08-22 DIAGNOSIS — J019 Acute sinusitis, unspecified: Secondary | ICD-10-CM | POA: Insufficient documentation

## 2015-08-22 DIAGNOSIS — Z87442 Personal history of urinary calculi: Secondary | ICD-10-CM | POA: Insufficient documentation

## 2015-08-22 DIAGNOSIS — Z8659 Personal history of other mental and behavioral disorders: Secondary | ICD-10-CM | POA: Insufficient documentation

## 2015-08-22 DIAGNOSIS — R5383 Other fatigue: Secondary | ICD-10-CM | POA: Insufficient documentation

## 2015-08-22 DIAGNOSIS — F17210 Nicotine dependence, cigarettes, uncomplicated: Secondary | ICD-10-CM | POA: Insufficient documentation

## 2015-08-22 MED ORDER — AMOXICILLIN 500 MG PO CAPS: 500.0000 mg | ORAL_CAPSULE | Freq: Three times a day (TID) | ORAL | Status: AC

## 2015-08-22 NOTE — ED Notes (Signed)
Pt reports sinus pressure and congestion since Saturday, associated with headache, took ibuprofen for headache x 1 with no relief, also took sinus/cold medication sat am with no relief

## 2015-08-22 NOTE — Discharge Instructions (Signed)
Amoxicillin as prescribed.  Sudafed as per package instructions as needed for congestion.  Follow-up with your primary Dr. if not improving in the next week.   Sinusitis, Adult Sinusitis is redness, soreness, and inflammation of the paranasal sinuses. Paranasal sinuses are air pockets within the bones of your face. They are located beneath your eyes, in the middle of your forehead, and above your eyes. In healthy paranasal sinuses, mucus is able to drain out, and air is able to circulate through them by way of your nose. However, when your paranasal sinuses are inflamed, mucus and air can become trapped. This can allow bacteria and other germs to grow and cause infection. Sinusitis can develop quickly and last only a short time (acute) or continue over a long period (chronic). Sinusitis that lasts for more than 12 weeks is considered chronic. CAUSES Causes of sinusitis include:  Allergies.  Structural abnormalities, such as displacement of the cartilage that separates your nostrils (deviated septum), which can decrease the air flow through your nose and sinuses and affect sinus drainage.  Functional abnormalities, such as when the small hairs (cilia) that line your sinuses and help remove mucus do not work properly or are not present. SIGNS AND SYMPTOMS Symptoms of acute and chronic sinusitis are the same. The primary symptoms are pain and pressure around the affected sinuses. Other symptoms include:  Upper toothache.  Earache.  Headache.  Bad breath.  Decreased sense of smell and taste.  A cough, which worsens when you are lying flat.  Fatigue.  Fever.  Thick drainage from your nose, which often is green and may contain pus (purulent).  Swelling and warmth over the affected sinuses. DIAGNOSIS Your health care provider will perform a physical exam. During your exam, your health care provider may perform any of the following to help determine if you have acute sinusitis or  chronic sinusitis:  Look in your nose for signs of abnormal growths in your nostrils (nasal polyps).  Tap over the affected sinus to check for signs of infection.  View the inside of your sinuses using an imaging device that has a light attached (endoscope). If your health care provider suspects that you have chronic sinusitis, one or more of the following tests may be recommended:  Allergy tests.  Nasal culture. A sample of mucus is taken from your nose, sent to a lab, and screened for bacteria.  Nasal cytology. A sample of mucus is taken from your nose and examined by your health care provider to determine if your sinusitis is related to an allergy. TREATMENT Most cases of acute sinusitis are related to a viral infection and will resolve on their own within 10 days. Sometimes, medicines are prescribed to help relieve symptoms of both acute and chronic sinusitis. These may include pain medicines, decongestants, nasal steroid sprays, or saline sprays. However, for sinusitis related to a bacterial infection, your health care provider will prescribe antibiotic medicines. These are medicines that will help kill the bacteria causing the infection. Rarely, sinusitis is caused by a fungal infection. In these cases, your health care provider will prescribe antifungal medicine. For some cases of chronic sinusitis, surgery is needed. Generally, these are cases in which sinusitis recurs more than 3 times per year, despite other treatments. HOME CARE INSTRUCTIONS  Drink plenty of water. Water helps thin the mucus so your sinuses can drain more easily.  Use a humidifier.  Inhale steam 3-4 times a day (for example, sit in the bathroom with the shower running).  Apply a warm, moist washcloth to your face 3-4 times a day, or as directed by your health care provider.  Use saline nasal sprays to help moisten and clean your sinuses.  Take medicines only as directed by your health care provider.  If  you were prescribed either an antibiotic or antifungal medicine, finish it all even if you start to feel better. SEEK IMMEDIATE MEDICAL CARE IF:  You have increasing pain or severe headaches.  You have nausea, vomiting, or drowsiness.  You have swelling around your face.  You have vision problems.  You have a stiff neck.  You have difficulty breathing.   This information is not intended to replace advice given to you by your health care provider. Make sure you discuss any questions you have with your health care provider.   Document Released: 09/02/2005 Document Revised: 09/23/2014 Document Reviewed: 09/17/2011 Elsevier Interactive Patient Education [employer].

## 2015-08-22 NOTE — ED Provider Notes (Signed)
CSN: [id number]     Arrival date & time 08/22/15  1028 History   First MD Initiated Contact with Patient 08/22/15 1137     Chief Complaint  Patient presents with  . Facial Pain     (Consider location/radiation/quality/duration/timing/severity/associated sxs/prior Treatment) HPI Comments: Patient is a 34 year old female with history of bipolar disorder. She presents for evaluation of sinus pressure and congestion for the past 10 days. She reports headaches and chills but no definite fever. She denies chest congestion but does report cough and postnasal drainage.  Patient is a 34 y.o. female presenting with URI. The history is provided by the patient.  URI Presenting symptoms: congestion, cough, fatigue, fever, rhinorrhea and sore throat   Severity:  Moderate Onset quality:  Gradual Duration:  10 days Timing:  Constant Progression:  Worsening Chronicity:  New Relieved by:  Nothing Worsened by:  Nothing tried Ineffective treatments:  None tried   Past Medical History  Diagnosis Date  . Bipolar disorder (HCC)     not on meds  . Nephrolithiasis     left    Past Surgical History  Procedure Laterality Date  . Left nephrostomy  Oct 29, 2010  . Tubal ligation  2008  . Nephrolithotomy  12/06/2011    Procedure: NEPHROLITHOTOMY PERCUTANEOUS;  Surgeon: Antony HasteMatthew Ramsey Eskridge, MD;  Location: [facility];  Service: Urology;  Laterality: Left;   History reviewed. No pertinent family history. Social History  Substance Use Topics  . Smoking status: Current Every Day Smoker -- 1.00 packs/day for 18 years    Types: Cigarettes  . Smokeless tobacco: Never Used  . Alcohol Use: Yes     Comment: very rare use   OB History    No data available     Review of Systems  Constitutional: Positive for fever and fatigue.  HENT: Positive for congestion, rhinorrhea and sore throat.   Respiratory: Positive for cough.   All other systems reviewed and are negative.     Allergies  Ketorolac  tromethamine; Ultram; and Codeine  Home Medications   Prior to Admission medications   Medication Sig Start Date End Date Taking? Authorizing Provider  ibuprofen (ADVIL,MOTRIN) 200 MG tablet Take 200 mg by mouth every 6 (six) hours as needed.   Yes Historical Provider, MD  pseudoephedrine-acetaminophen (TYLENOL SINUS) 30-500 MG TABS tablet Take 1 tablet by mouth every 4 (four) hours as needed.   Yes Historical Provider, MD   BP 120/58 mmHg  Pulse 82  Temp(Src) 98.2 F (36.8 C) (Oral)  Resp 16  Ht 5' (1.524 m)  Wt 152 lb (68.947 kg)  BMI 29.69 kg/m2  SpO2 100%  LMP 08/16/2015 Physical Exam  Constitutional: She is oriented to person, place, and time. She appears well-developed and well-nourished. No distress.  HENT:  Head: Normocephalic and atraumatic.  Mouth/Throat: Oropharynx is clear and moist.  There is bilateral maxillary and frontal sinus tenderness.  Neck: Normal range of motion. Neck supple.  Cardiovascular: Normal rate and regular rhythm.  Exam reveals no gallop and no friction rub.   No murmur heard. Pulmonary/Chest: Effort normal and breath sounds normal. No respiratory distress. She has no wheezes.  Abdominal: Soft. Bowel sounds are normal. She exhibits no distension. There is no tenderness.  Musculoskeletal: Normal range of motion.  Lymphadenopathy:    She has cervical adenopathy.  Neurological: She is alert and oriented to person, place, and time.  Skin: Skin is warm and dry. She is not diaphoretic.  Nursing note and vitals reviewed.  ED Course  Procedures (including critical care time) Labs Review Labs Reviewed - No data to display  Imaging Review No results found. I have personally reviewed and evaluated these images and lab results as part of my medical decision-making.   EKG Interpretation None      MDM   Final diagnoses:  None    Symptoms for 10 days unrelieved with over-the-counter medications. Her symptoms sound like a sinus infection. She  will be treated with amoxicillin and Sudafed for an acute sinusitis.    Geoffery Lyons, MD 08/22/15 [phone]
